# Patient Record
Sex: Female | Born: 1995 | Race: Black or African American | Hispanic: No | Marital: Single | Smoking: Never smoker
Health system: Southern US, Community
[De-identification: ages and names within clinical notes are randomized; demographics above are authoritative.]

## PROBLEM LIST (undated history)

## (undated) ENCOUNTER — Inpatient Hospital Stay (HOSPITAL_COMMUNITY): Payer: Self-pay

## (undated) DIAGNOSIS — R51 Headache: Secondary | ICD-10-CM

## (undated) HISTORY — PX: NO PAST SURGERIES: SHX2092

## (undated) HISTORY — DX: Headache: R51

---
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
## 2013-07-08 IMAGING — US US PELVIS COMPLETE
1 series · 13 of 25 positions shown · non-contrast
Comparison: None.

CLINICAL DATA: Pelvic pain.  Left lower quadrant pelvic pain.
Evaluate for pelvic inflammatory disease, torsion, or abscess.

TRANSABDOMINAL AND TRANSVAGINAL ULTRASOUND OF PELVIS
DOPPLER ULTRASOUND OF OVARIES
TECHNIQUE: Both transabdominal and transvaginal ultrasound
examinations of the pelvis were performed. Transabdominal technique
was performed for global imaging of the pelvis including uterus,
ovaries, adnexal regions, and pelvic cul-de-sac.
It was necessary to proceed with endovaginal exam following the
transabdominal exam to visualize the ovaries and adnexa.
Color and duplex Doppler ultrasound was utilized to evaluate blood
flow to the ovaries.

[Series 1: us pelvis complete · 0.30mm/px · 13 of 90 slices shown]
[im 1/90]
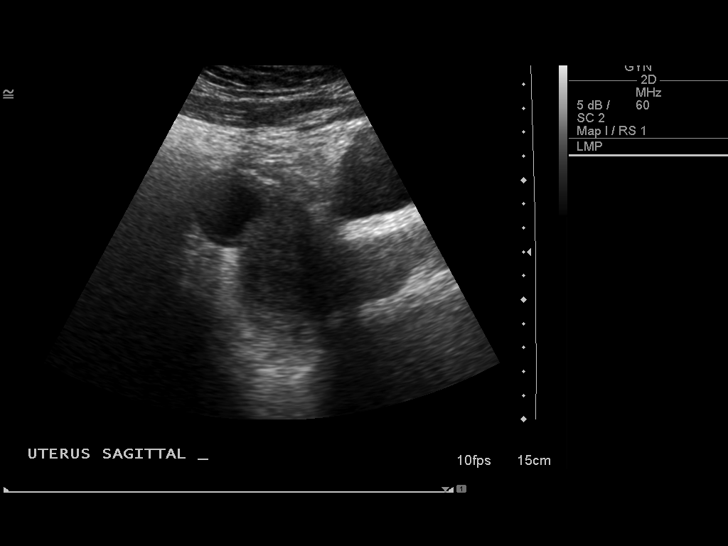
[im 8/90]
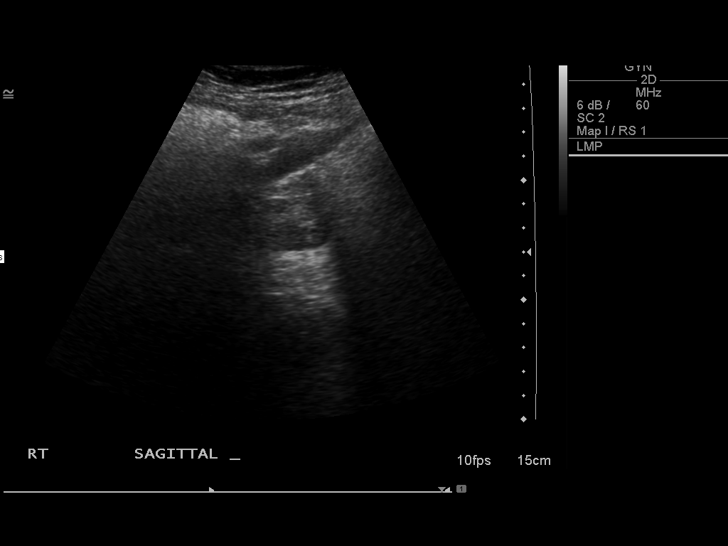
[im 15/90]
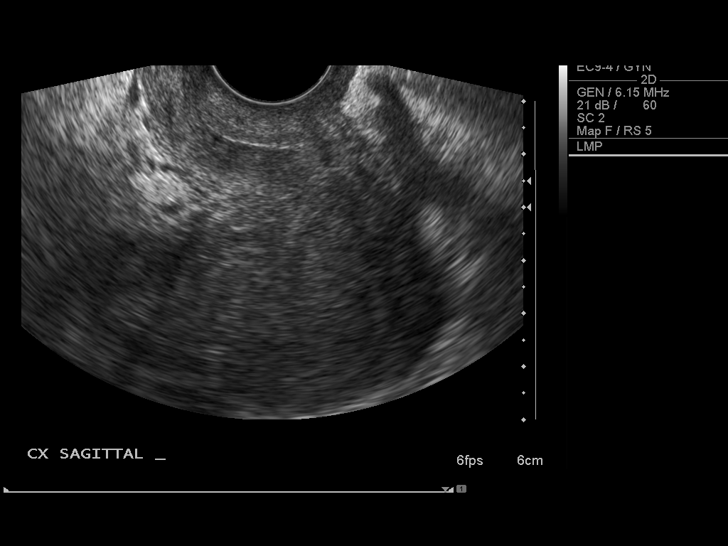
[im 23/90]
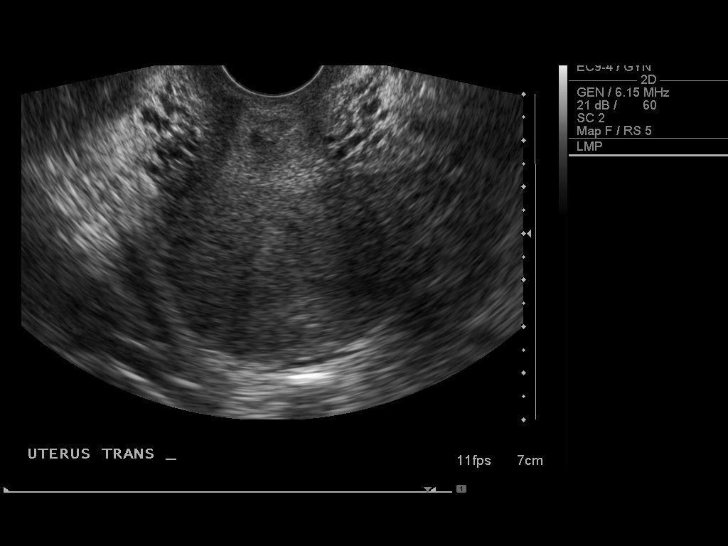
[im 30/90]
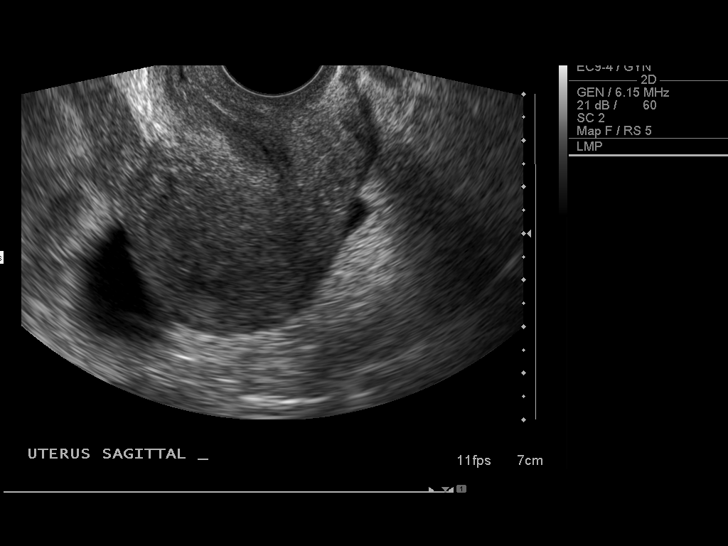
[im 38/90]
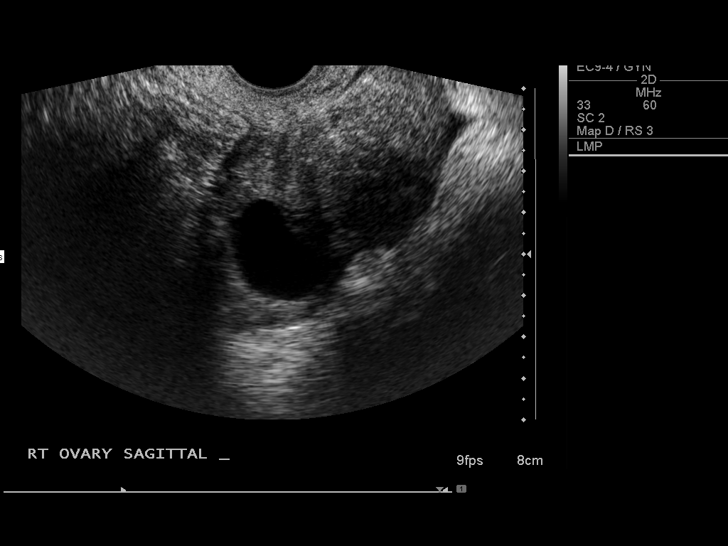
[im 45/90]
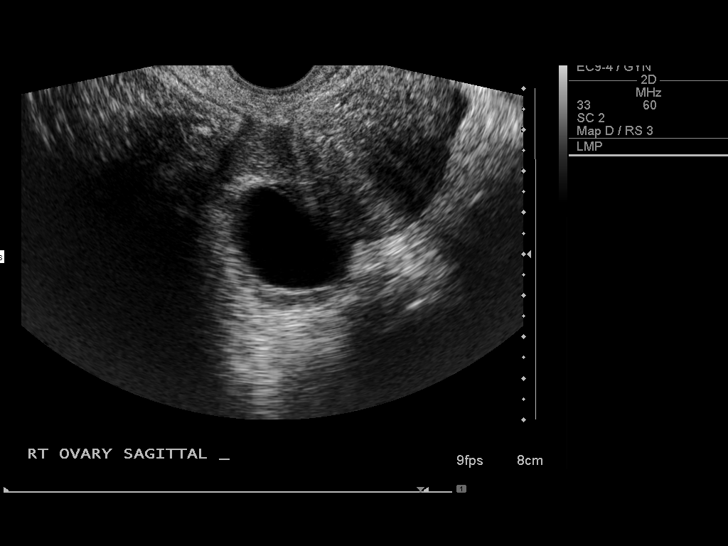
[im 52/90]
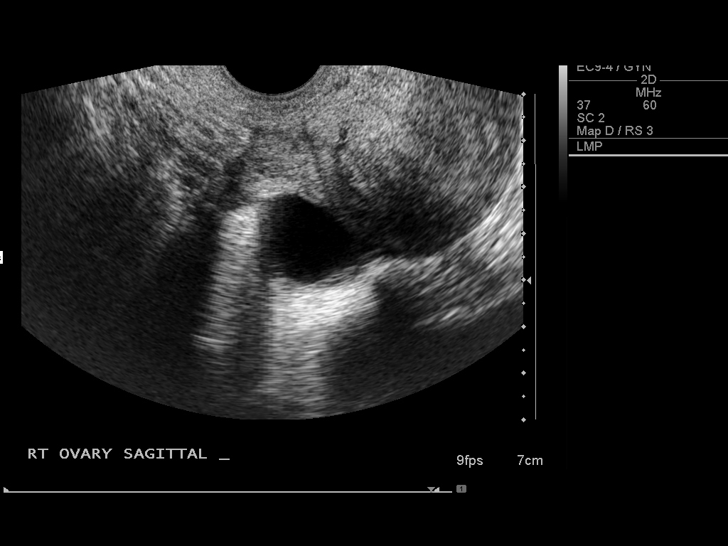
[im 60/90]
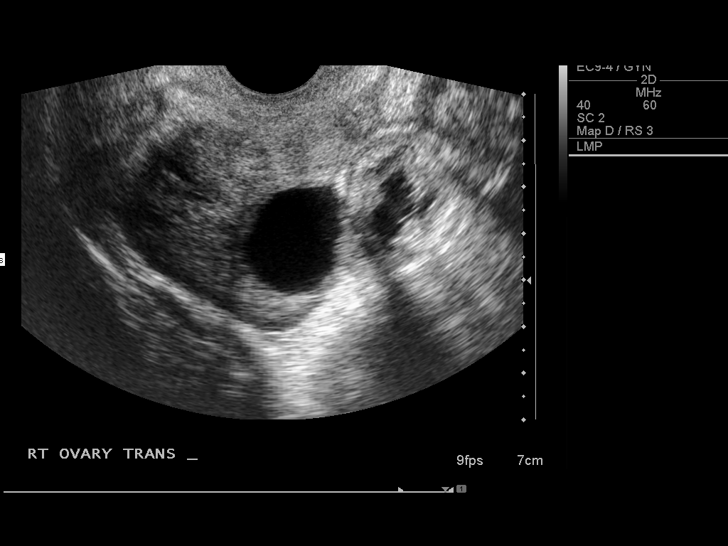
[im 67/90]
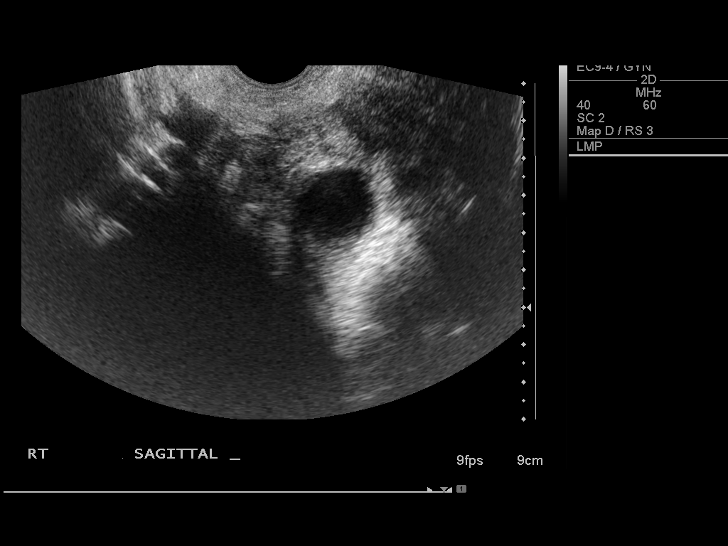
[im 75/90]
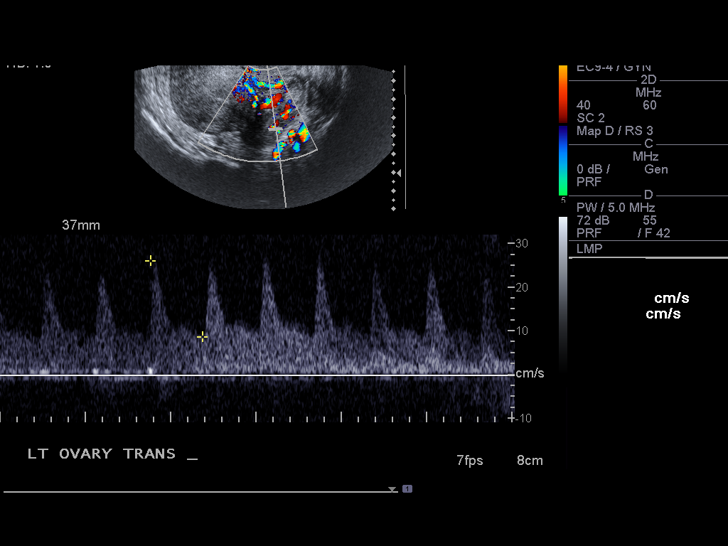
[im 82/90]
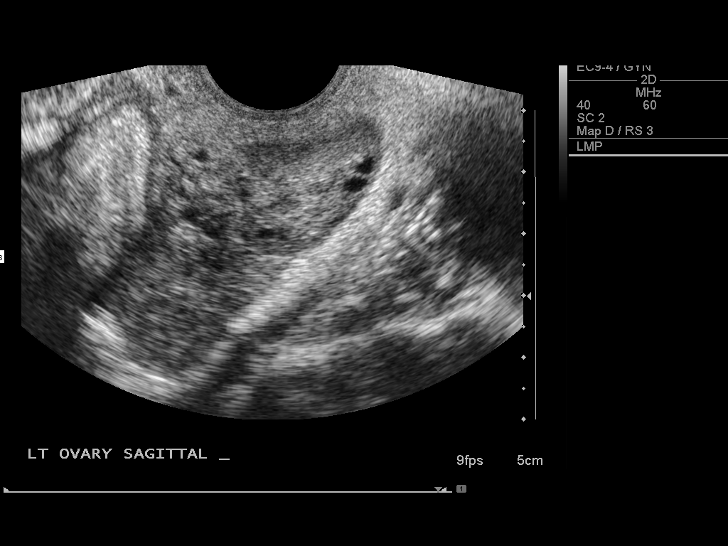
[im 90/90]
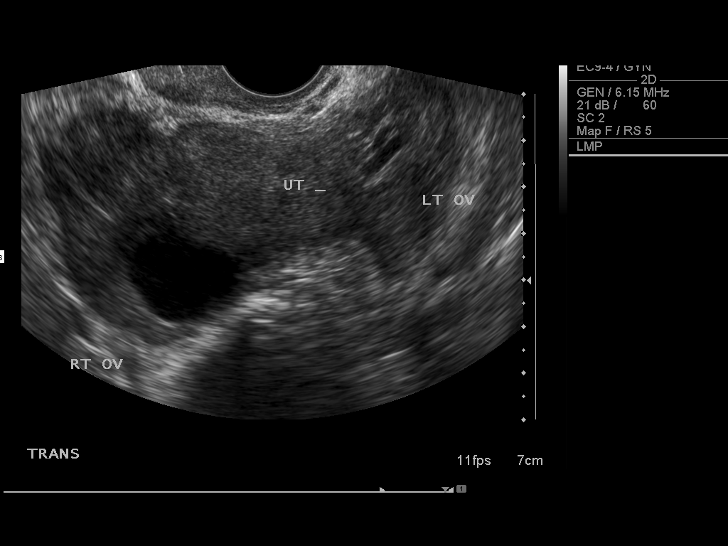

[13 of 25 positions shown; findings below may reference images not displayed]

FINDINGS: Uterus:  Uterus is partly retroverted measures 5.8 x 4.6 x 4.5 cm.
No focal uterine mass is identified.

Endometrium:  Normal in thickness and appearance.  Measures 5 mm.

Right ovary: Right ovary measures approximately 4.8 x 3.5 x 4.5 cm.
The right ovary is prominent in size and contains a 2.8 cm dominant
follicle versus cyst.  Color Doppler flow is identified to the
right ovary.

Left ovary:   Measures 2.3 x 2.9 x 2.1 cm and has normal
appearances. Color Doppler flow is identified to the left ovary. No
cyst or mass is identified in the left ovary. No left adnexal mass
is seen.

Pulsed Doppler evaluation demonstrates normal low-resistance
arterial and venous waveforms in both ovaries.

No definite free pelvic fluid is visualized.

The sonographer reports that the patient did not experience pain
during examination.
IMPRESSION: 1.  Right ovary is slightly prominent in size and contains a 2.8 cm
cyst.  There is normal color Doppler flow to the right ovary.  No
definite sonographic evidence of ovarian torsion.
2.  Normal left ovary
3.  Normal uterus.
4.  No evidence of hydrosalpinx or pelvic abscess.

## 2013-12-06 ENCOUNTER — Encounter (HOSPITAL_COMMUNITY): Payer: Self-pay

## 2013-12-06 ENCOUNTER — Emergency Department (INDEPENDENT_AMBULATORY_CARE_PROVIDER_SITE_OTHER)
Admission: EM | Admit: 2013-12-06 | Discharge: 2013-12-06 | Disposition: A | Discharge status: Home / Self Care | Payer: BC Managed Care – PPO | Source: Home / Self Care | Attending: Family Medicine | Admitting: Family Medicine

## 2013-12-06 DIAGNOSIS — J029 Acute pharyngitis, unspecified: Secondary | ICD-10-CM

## 2013-12-06 LAB — POCT RAPID STREP A: Streptococcus, Group A Screen (Direct): NEGATIVE

## 2013-12-06 NOTE — ED Provider Notes (Signed)
CSN: [id number]     Arrival date & time 12/06/13  1944 History   First MD Initiated Contact with Patient 12/06/13 1959     Chief Complaint  Patient presents with  . Sore Throat   (Consider location/radiation/quality/duration/timing/severity/associated sxs/prior Treatment) Patient is a 18 y.o. female presenting with pharyngitis. The history is provided by the patient.  Sore Throat This is a new problem. The current episode started 1 to 2 hours ago. The problem occurs constantly. The problem has not changed since onset.   Past Medical History  Diagnosis Date  . Headache(784.0)    History reviewed. No pertinent past surgical history. Family History  Problem Relation Age of Onset  . Migraines Mother   . Hypertension Mother   . Diabetes Mother   . Asthma Brother   . Migraines Maternal Aunt    History  Substance Use Topics  . Smoking status: Current Some Day Smoker  . Smokeless tobacco: Never Used  . Alcohol Use: Yes   OB History    Gravida Para Term Preterm AB TAB SAB Ectopic Multiple Living   0 0 0            Review of Systems  All other systems reviewed and are negative.   Allergies  Other  Home Medications   Prior to Admission medications   Medication Sig Start Date End Date Taking? Authorizing Provider  doxycycline (VIBRAMYCIN) 100 MG capsule Take 1 capsule (100 mg total) by mouth 2 (two) times daily. 01/26/12   [name], PA-C  ibuprofen (ADVIL,MOTRIN) 200 MG tablet Take 400 mg by mouth every 6 (six) hours as needed. For pain    Historical Provider, MD  medroxyPROGESTERone (DEPO-PROVERA) 150 MG/ML injection Inject 150 mg into the muscle every 3 (three) months. 08/04/11   Elmira Powell, PA-C  ondansetron (ZOFRAN) 4 MG tablet Take 1 tablet (4 mg total) by mouth every 6 (six) hours. 01/26/12   [name], PA-C  oxyCODONE-acetaminophen (PERCOCET) 10-650 MG per tablet Take 1 tablet by mouth every 6 (six) hours as needed for pain. 01/26/12   Hannah  Muthersbaugh, PA-C   BP 119/80 mmHg  Pulse 102  Temp(Src) 98.3 F (36.8 C) (Oral)  Resp 12  SpO2 100% Physical Exam  Constitutional: She is oriented to person, place, and time. She appears well-developed and well-nourished. No distress.  HENT:  Head: Normocephalic and atraumatic.  Right Ear: Hearing, tympanic membrane, external ear and ear canal normal.  Left Ear: Hearing, tympanic membrane, external ear and ear canal normal.  Nose: Nose normal.  Mouth/Throat: Uvula is midline and mucous membranes are normal. No trismus in the jaw. No uvula swelling. Posterior oropharyngeal erythema present. No oropharyngeal exudate, posterior oropharyngeal edema or tonsillar abscesses.    Eyes: Conjunctivae are normal. No scleral icterus.  Neck: Normal range of motion. Neck supple.  Cardiovascular: Normal rate, regular rhythm and normal heart sounds.   Pulmonary/Chest: Effort normal and breath sounds normal. No stridor.  Lymphadenopathy:    She has no cervical adenopathy.  Neurological: She is alert and oriented to person, place, and time.  Skin: Skin is warm and dry. No rash noted. No erythema.  Psychiatric: She has a normal mood and affect. Her behavior is normal.  Nursing note and vitals reviewed.   ED Course  Procedures (including critical care time) Labs Review Labs Reviewed  POCT RAPID STREP A ([facility] CARE ONLY)    Imaging Review No results found.   MDM   1. Pharyngitis   Rapid strep  negative Symptomatic care at home Follow up if no improvement over the next 4-5 days.     Ria ClockJennifer Lee H Darroll Bredeson, [place]PA 12/06/13 2020

## 2013-12-06 NOTE — ED Notes (Signed)
Reports 4 hour duration of sore throat, parent noted "white spots on tonsils"

## 2013-12-06 NOTE — ED Notes (Signed)
Please call patient at :[phone] for any lab issues

## 2013-12-06 NOTE — Discharge Instructions (Signed)
Strep test negative. Specimen will be held for 3 day culture. If results indicate the need for additional treatment, you will be notified by phone. Use warm salt water gargles, tylenol or ibuprofen as needed for discomfort. Follow up if no improvement over the next 4-5 days.  Pharyngitis Pharyngitis is redness, pain, and swelling (inflammation) of your pharynx.  CAUSES  Pharyngitis is usually caused by infection. Most of the time, these infections are from viruses (viral) and are part of a cold. However, sometimes pharyngitis is caused by bacteria (bacterial). Pharyngitis can also be caused by allergies. Viral pharyngitis may be spread from person to person by coughing, sneezing, and personal items or utensils (cups, forks, spoons, toothbrushes). Bacterial pharyngitis may be spread from person to person by more intimate contact, such as kissing.  SIGNS AND SYMPTOMS  Symptoms of pharyngitis include:   Sore throat.   Tiredness (fatigue).   Low-grade fever.   Headache.  Joint pain and muscle aches.  Skin rashes.  Swollen lymph nodes.  Plaque-like film on throat or tonsils (often seen with bacterial pharyngitis). DIAGNOSIS  Your health care provider will ask you questions about your illness and your symptoms. Your medical history, along with a physical exam, is often all that is needed to diagnose pharyngitis. Sometimes, a rapid strep test is done. Other lab tests may also be done, depending on the suspected cause.  TREATMENT  Viral pharyngitis will usually get better in 3-4 days without the use of medicine. Bacterial pharyngitis is treated with medicines that kill germs (antibiotics).  HOME CARE INSTRUCTIONS   Drink enough water and fluids to keep your urine clear or pale yellow.   Only take over-the-counter or prescription medicines as directed by your health care provider:   If you are prescribed antibiotics, make sure you finish them even if you start to feel better.   Do  not take aspirin.   Get lots of rest.   Gargle with 8 oz of salt water ( tsp of salt per 1 qt of water) as often as every 1-2 hours to soothe your throat.   Throat lozenges (if you are not at risk for choking) or sprays may be used to soothe your throat. SEEK MEDICAL CARE IF:   You have large, tender lumps in your neck.  You have a rash.  You cough up green, yellow-brown, or bloody spit. SEEK IMMEDIATE MEDICAL CARE IF:   Your neck becomes stiff.  You drool or are unable to swallow liquids.  You vomit or are unable to keep medicines or liquids down.  You have severe pain that does not go away with the use of recommended medicines.  You have trouble breathing (not caused by a stuffy nose). MAKE SURE YOU:   Understand these instructions.  Will watch your condition.  Will get help right away if you are not doing well or get worse. Document Released: 01/12/2005 Document Revised: 11/02/2012 Document Reviewed: 09/19/2012 [facility] Patient Information 2015 [place], [place]. This information is not intended to replace advice given to you by your health care provider. Make sure you discuss any questions you have with your health care provider.

## 2013-12-07 ENCOUNTER — Emergency Department (HOSPITAL_COMMUNITY)
Admission: EM | Admit: 2013-12-07 | Discharge: 2013-12-07 | Disposition: A | Discharge status: Home / Self Care | Payer: BC Managed Care – PPO | Attending: Emergency Medicine | Admitting: Emergency Medicine

## 2013-12-07 ENCOUNTER — Encounter (HOSPITAL_COMMUNITY): Payer: Self-pay

## 2013-12-07 DIAGNOSIS — Z79899 Other long term (current) drug therapy: Secondary | ICD-10-CM | POA: Diagnosis not present

## 2013-12-07 DIAGNOSIS — Z792 Long term (current) use of antibiotics: Secondary | ICD-10-CM | POA: Insufficient documentation

## 2013-12-07 DIAGNOSIS — Z720 Tobacco use: Secondary | ICD-10-CM | POA: Insufficient documentation

## 2013-12-07 DIAGNOSIS — J029 Acute pharyngitis, unspecified: Secondary | ICD-10-CM | POA: Diagnosis not present

## 2013-12-07 LAB — CBC WITH DIFFERENTIAL/PLATELET
BASOS ABS: 0 10*3/uL (ref 0.0–0.1)
Basophils Relative: 0 % (ref 0–1)
EOS PCT: 0 % (ref 0–5)
Eosinophils Absolute: 0 10*3/uL (ref 0.0–0.7)
HEMATOCRIT: 37.7 % (ref 36.0–46.0)
Hemoglobin: 12.5 g/dL (ref 12.0–15.0)
Lymphocytes Relative: 14 % (ref 12–46)
Lymphs Abs: 1.9 10*3/uL (ref 0.7–4.0)
MCH: 30.8 pg (ref 26.0–34.0)
MCHC: 33.2 g/dL (ref 30.0–36.0)
MCV: 92.9 fL (ref 78.0–100.0)
MONO ABS: 1.4 10*3/uL — AB (ref 0.1–1.0)
Monocytes Relative: 10 % (ref 3–12)
Neutro Abs: 10.4 10*3/uL — ABNORMAL HIGH (ref 1.7–7.7)
Neutrophils Relative %: 76 % (ref 43–77)
Platelets: 374 10*3/uL (ref 150–400)
RBC: 4.06 MIL/uL (ref 3.87–5.11)
RDW: 12.8 % (ref 11.5–15.5)
WBC: 13.7 10*3/uL — ABNORMAL HIGH (ref 4.0–10.5)

## 2013-12-07 LAB — MONONUCLEOSIS SCREEN: Mono Screen: NEGATIVE

## 2013-12-07 LAB — RAPID STREP SCREEN (MED CTR MEBANE ONLY): STREPTOCOCCUS, GROUP A SCREEN (DIRECT): NEGATIVE

## 2013-12-07 MED ORDER — PREDNISONE 10 MG PO TABS: 20.0000 mg | ORAL_TABLET | Freq: Every day | ORAL | Status: DC

## 2013-12-07 MED ORDER — PREDNISONE 20 MG PO TABS
60.0000 mg | ORAL_TABLET | Freq: Once | ORAL | Status: AC
  Administered 2013-12-07: 60 mg via ORAL
  Filled 2013-12-07: qty 3

## 2013-12-07 MED ORDER — ACETAMINOPHEN 500 MG PO TABS
500.0000 mg | ORAL_TABLET | Freq: Once | ORAL | Status: AC
  Administered 2013-12-07: 500 mg via ORAL
  Filled 2013-12-07: qty 1

## 2013-12-07 MED ORDER — PENICILLIN V POTASSIUM 500 MG PO TABS: 500.0000 mg | ORAL_TABLET | Freq: Four times a day (QID) | ORAL | Status: DC

## 2013-12-07 MED ORDER — IBUPROFEN 800 MG PO TABS
800.0000 mg | ORAL_TABLET | Freq: Once | ORAL | Status: AC
  Administered 2013-12-07: 800 mg via ORAL
  Filled 2013-12-07: qty 1

## 2013-12-07 MED ORDER — HYDROCODONE-ACETAMINOPHEN 5-325 MG PO TABS
1.0000 | ORAL_TABLET | Freq: Once | ORAL | Status: AC
  Administered 2013-12-07: 1 via ORAL
  Filled 2013-12-07: qty 1

## 2013-12-07 MED ORDER — HYDROCODONE-ACETAMINOPHEN 5-325 MG PO TABS: 1.0000 | ORAL_TABLET | ORAL | Status: DC | PRN

## 2013-12-07 NOTE — ED Provider Notes (Signed)
CSN: [id number]     Arrival date & time 12/07/13  1355 History   First MD Initiated Contact with Patient 12/07/13 1459     Chief Complaint  Patient presents with  . Sore Throat     HPI  Patient presented evaluation of sore throat and swollen tonsils. Present sore throat for 3 weeks. Negative strep 2 days ago at urgent care. Continues with her trouble swallowing. Painful swallowing. Symmetric pain no localizing pain not drooling. Abdominal pain. No rash. No joint pain.  Past Medical History  Diagnosis Date  . Headache(784.0)    History reviewed. No pertinent past surgical history. Family History  Problem Relation Age of Onset  . Migraines Mother   . Hypertension Mother   . Diabetes Mother   . Asthma Brother   . Migraines Maternal Aunt    History  Substance Use Topics  . Smoking status: Current Some Day Smoker  . Smokeless tobacco: Never Used  . Alcohol Use: Yes   OB History    Gravida Para Term Preterm AB TAB SAB Ectopic Multiple Living   0 0 0            Review of Systems  Constitutional: Positive for fever. Negative for chills, diaphoresis, appetite change and fatigue.  HENT: Positive for sore throat. Negative for mouth sores and trouble swallowing.   Eyes: Negative for visual disturbance.  Respiratory: Negative for cough, chest tightness, shortness of breath and wheezing.   Cardiovascular: Negative for chest pain.  Gastrointestinal: Negative for nausea, vomiting, abdominal pain, diarrhea and abdominal distention.  Endocrine: Negative for polydipsia, polyphagia and polyuria.  Genitourinary: Negative for dysuria, frequency and hematuria.  Musculoskeletal: Negative for gait problem.  Skin: Negative for color change, pallor and rash.  Neurological: Negative for dizziness, syncope, light-headedness and headaches.  Hematological: Does not bruise/bleed easily.  Psychiatric/Behavioral: Negative for behavioral problems and confusion.      Allergies  Other  Home  Medications   Prior to Admission medications   Medication Sig Start Date End Date Taking? Authorizing Provider  guaiFENesin (ROBITUSSIN) 100 MG/5ML liquid Take 400 mg by mouth 3 (three) times daily as needed for cough (cough).   Yes Historical Provider, MD  ibuprofen (ADVIL,MOTRIN) 200 MG tablet Take 600 mg by mouth every 6 (six) hours as needed for headache (headache). For pain   Yes Historical Provider, MD  doxycycline (VIBRAMYCIN) 100 MG capsule Take 1 capsule (100 mg total) by mouth 2 (two) times daily. 01/26/12   [name], PA-C  HYDROcodone-acetaminophen (NORCO/VICODIN) 5-325 MG per tablet Take 1 tablet by mouth every 4 (four) hours as needed. 12/07/13   Rolland PorterMark Prithvi Kooi, MD  medroxyPROGESTERone (DEPO-PROVERA) 150 MG/ML injection Inject 150 mg into the muscle every 3 (three) months. 08/04/11   Elmira Powell, PA-C  ondansetron (ZOFRAN) 4 MG tablet Take 1 tablet (4 mg total) by mouth every 6 (six) hours. 01/26/12   [name], PA-C  oxyCODONE-acetaminophen (PERCOCET) 10-650 MG per tablet Take 1 tablet by mouth every 6 (six) hours as needed for pain. 01/26/12   [name], PA-C  penicillin v potassium (VEETID) 500 MG tablet Take 1 tablet (500 mg total) by mouth 4 (four) times daily. 12/07/13   Rolland PorterMark Clearance Chenault, MD  predniSONE (DELTASONE) 10 MG tablet Take 2 tablets (20 mg total) by mouth daily. 12/07/13   Rolland PorterMark Mareta Chesnut, MD   BP 119/67 mmHg  Pulse 102  Temp(Src) 102 F (38.9 C) (Oral)  Resp 18  SpO2 97% Physical Exam  Constitutional: She is oriented  to person, place, and time. She appears well-developed and well-nourished. No distress.  HENT:  Head: Normocephalic.  Mouth/Throat: Oropharyngeal exudate present.  3+ symmetric tonsillar hypertrophy. Critical tonsils. White exudate pretreated petechiae the palate.*Breakdown. Bilateral intracervical lymph nodes. No posterior cervical lymph nodes. No hepatosplenomegaly.  Eyes: Conjunctivae are normal. Pupils are equal, round, and  reactive to light. No scleral icterus.  Neck: Normal range of motion. Neck supple. No thyromegaly present.  Cardiovascular: Normal rate and regular rhythm.  Exam reveals no gallop and no friction rub.   No murmur heard. Pulmonary/Chest: Effort normal and breath sounds normal. No respiratory distress. She has no wheezes. She has no rales.  Abdominal: Soft. Bowel sounds are normal. She exhibits no distension. There is no tenderness. There is no rebound.  Musculoskeletal: Normal range of motion.  Lymphadenopathy:    She has cervical adenopathy.  Neurological: She is alert and oriented to person, place, and time.  Skin: Skin is warm and dry. No rash noted.  Psychiatric: She has a normal mood and affect. Her behavior is normal.    ED Course  Procedures (including critical care time) Labs Review Labs Reviewed  CBC WITH DIFFERENTIAL - Abnormal; Notable for the following:    WBC 13.7 (*)    Neutro Abs 10.4 (*)    Monocytes Absolute 1.4 (*)    All other components within normal limits  RAPID STREP SCREEN  CULTURE, GROUP A STREP  MONONUCLEOSIS SCREEN    Imaging Review No results found.   EKG Interpretation None      MDM   Final diagnoses:  Pharyngitis    No atypical lymphocytes. Negative mono screen for heterophile antibody. Negative initial strep. Fever improved after, Motrin. Plan discharge home. She has multiple criteria for strep with petechiae, enlarged lymph nodes, fever, next tonsils. One particular treatment penicillin pending her culture despite her negative initial negative strep. Plan also Vicodin prednisone for pain and swelling at home.    Rolland PorterMark Christal Lagerstrom, MD 12/07/13 [phone]

## 2013-12-07 NOTE — ED Notes (Signed)
MD notified of temperature

## 2013-12-07 NOTE — Discharge Instructions (Signed)

## 2013-12-07 NOTE — ED Notes (Signed)
Per pt, went to urgent care last night.  Pt had negative strep.  Pt told to take tylenol for pain.  Pt states pain not improved.

## 2013-12-08 LAB — CULTURE, GROUP A STREP

## 2013-12-09 ENCOUNTER — Telehealth (HOSPITAL_COMMUNITY): Payer: Self-pay | Admitting: *Deleted

## 2013-12-09 NOTE — ED Notes (Signed)
Throat culture: Strep beta hemolytic not group A. Pt. Was treated with Rx. Veetid 11/12 by the ED.  I called pt. Pt. verified x 2 and given result. Pt. told she is adequately treated and to finish all of the medication. If not better to get rechecked. If anyone she exposed gets the same symptoms should get checked for strep as well.  Pt. voiced understanding. Vassie MoselleYork, Crystal Carrillo 12/09/2013

## 2013-12-12 ENCOUNTER — Encounter (HOSPITAL_COMMUNITY): Payer: Self-pay | Admitting: *Deleted

## 2013-12-12 ENCOUNTER — Emergency Department (HOSPITAL_COMMUNITY)
Admission: EM | Admit: 2013-12-12 | Discharge: 2013-12-12 | Disposition: A | Discharge status: Home / Self Care | Payer: BC Managed Care – PPO | Attending: Emergency Medicine | Admitting: Emergency Medicine

## 2013-12-12 DIAGNOSIS — Z3202 Encounter for pregnancy test, result negative: Secondary | ICD-10-CM | POA: Diagnosis not present

## 2013-12-12 DIAGNOSIS — K5900 Constipation, unspecified: Secondary | ICD-10-CM | POA: Diagnosis not present

## 2013-12-12 DIAGNOSIS — Z720 Tobacco use: Secondary | ICD-10-CM | POA: Insufficient documentation

## 2013-12-12 DIAGNOSIS — Z792 Long term (current) use of antibiotics: Secondary | ICD-10-CM | POA: Insufficient documentation

## 2013-12-12 DIAGNOSIS — Z7952 Long term (current) use of systemic steroids: Secondary | ICD-10-CM | POA: Diagnosis not present

## 2013-12-12 DIAGNOSIS — Z79899 Other long term (current) drug therapy: Secondary | ICD-10-CM | POA: Insufficient documentation

## 2013-12-12 DIAGNOSIS — N390 Urinary tract infection, site not specified: Secondary | ICD-10-CM | POA: Insufficient documentation

## 2013-12-12 LAB — URINE MICROSCOPIC-ADD ON

## 2013-12-12 LAB — POC URINE PREG, ED: Preg Test, Ur: NEGATIVE

## 2013-12-12 LAB — URINALYSIS, ROUTINE W REFLEX MICROSCOPIC
Bilirubin Urine: NEGATIVE
Glucose, UA: NEGATIVE mg/dL
Ketones, ur: NEGATIVE mg/dL
Nitrite: NEGATIVE
PH: 6.5 (ref 5.0–8.0)
Protein, ur: NEGATIVE mg/dL
SPECIFIC GRAVITY, URINE: 1.025 (ref 1.005–1.030)
Urobilinogen, UA: 0.2 mg/dL (ref 0.0–1.0)

## 2013-12-12 LAB — POC OCCULT BLOOD, ED: Fecal Occult Bld: POSITIVE — AB

## 2013-12-12 MED ORDER — SULFAMETHOXAZOLE-TRIMETHOPRIM 800-160 MG PO TABS: 1.0000 | ORAL_TABLET | Freq: Two times a day (BID) | ORAL | Status: DC

## 2013-12-12 MED ORDER — DOCUSATE SODIUM 100 MG PO CAPS: 100.0000 mg | ORAL_CAPSULE | Freq: Two times a day (BID) | ORAL | Status: DC | PRN

## 2013-12-12 MED ORDER — IBUPROFEN 200 MG PO TABS
600.0000 mg | ORAL_TABLET | Freq: Once | ORAL | Status: AC
  Administered 2013-12-12: 600 mg via ORAL
  Filled 2013-12-12: qty 3

## 2013-12-12 MED ORDER — SULFAMETHOXAZOLE-TRIMETHOPRIM 800-160 MG PO TABS
1.0000 | ORAL_TABLET | Freq: Two times a day (BID) | ORAL | Status: DC
Start: 2013-12-12 — End: 2015-05-23

## 2013-12-12 NOTE — Progress Notes (Signed)
  CARE MANAGEMENT ED NOTE 12/12/2013  Patient:  Kathrene AluMARTIN,Sherene   Account Number:  [id number]  Date Initiated:  12/12/2013  Documentation initiated by:  Edd ArbourGIBBS,KIMBERLY  Subjective/Objective Assessment:   18 yr old female who states "I was student" "lost insurance card"  currently being treated with abx for strep throat, reports she started having rectal pain and constipation x2 days.     Subjective/Objective Assessment Detail:   [place] via mother Pilar Jar[place], [place]    Pt wrote dow customer service number [phone]  and CM office number if further questions  Voiced understanding of what cm reviewed  Able to repeat back to CM what was discussed with her about online, customer services and f/u with pc for ongoing or chronic s/s     Action/Plan:   ED cm noted ED visits x 3 in last month, discussed with pt, discussed pcp services vs EDP services (differences, monitoring of chronic medical issues)  see notes below Provided name of coverage, policy # per request   Action/Plan Detail:   Discussed also how to obtain specialist prn   Anticipated DC Date:       Status Recommendation to Physician:   Result of Recommendation:    Other ED Services  Consult Working Plan    DC Planning Services  Other  Outpatient Services - Pt will follow up  PCP issues    Choice offered to / List presented to:            Status of service:  Completed, signed off  ED Comments:   ED Comments Detail:  [facility] ED CM spoke with pt on how to obtain an in network pcp with insurance coverage via the customer service number or web site Cm reviewed ED level of care for crisis/emergent services and community pcp level of care to manage continuous or chronic medical concerns.  The pt voiced understanding CM encouraged pt and discussed pt's responsibility to verify with pt's insurance carrier that any recommended medical provider offered by any emergency room or a  hospital provider is within the carrier's network. The pt voiced understanding

## 2013-12-12 NOTE — ED Notes (Signed)
Pt reports she is currently being treated with abx for strep throat, reports she started having rectal pain and constipation x2 days. Pain 7/10 at present. Denies anal sex. Pt has tried prep h cream and wipes, used enema last night. Passed small amount of stool after enema with pain. Last period Oct 2.

## 2013-12-12 NOTE — Discharge Instructions (Signed)
Constipation °Constipation is when a person has fewer than three bowel movements a week, has difficulty having a bowel movement, or has stools that are dry, hard, or larger than normal. As people grow older, constipation is more common. If you try to fix constipation with medicines that make you have a bowel movement (laxatives), the problem may get worse. Long-term laxative use may cause the muscles of the colon to become weak. A low-fiber diet, not taking in enough fluids, and taking certain medicines may make constipation worse.  °CAUSES  °· Certain medicines, such as antidepressants, pain medicine, iron supplements, antacids, and water pills.   °· Certain diseases, such as diabetes, irritable bowel syndrome (IBS), thyroid disease, or depression.   °· Not drinking enough water.   °· Not eating enough fiber-rich foods.   °· Stress or travel.   °· Lack of physical activity or exercise.   °· Ignoring the urge to have a bowel movement.   °· Using laxatives too much.   °SIGNS AND SYMPTOMS  °· Having fewer than three bowel movements a week.   °· Straining to have a bowel movement.   °· Having stools that are hard, dry, or larger than normal.   °· Feeling full or bloated.   °· Pain in the lower abdomen.   °· Not feeling relief after having a bowel movement.   °DIAGNOSIS  °Your health care provider will take a medical history and perform a physical exam. Further testing may be done for severe constipation. Some tests may include: °· A barium enema X-ray to examine your rectum, colon, and, sometimes, your small intestine.   °· A sigmoidoscopy to examine your lower colon.   °· A colonoscopy to examine your entire colon. °TREATMENT  °Treatment will depend on the severity of your constipation and what is causing it. Some dietary treatments include drinking more fluids and eating more fiber-rich foods. Lifestyle treatments may include regular exercise. If these diet and lifestyle recommendations do not help, your health care  provider may recommend taking over-the-counter laxative medicines to help you have bowel movements. Prescription medicines may be prescribed if over-the-counter medicines do not work.  °HOME CARE INSTRUCTIONS  °· Eat foods that have a lot of fiber, such as fruits, vegetables, whole grains, and beans. °· Limit foods high in fat and processed sugars, such as french fries, hamburgers, cookies, candies, and soda.   °· A fiber supplement may be added to your diet if you cannot get enough fiber from foods.   °· Drink enough fluids to keep your urine clear or pale yellow.   °· Exercise regularly or as directed by your health care provider.   °· Go to the restroom when you have the urge to go. Do not hold it.   °· Only take over-the-counter or prescription medicines as directed by your health care provider. Do not take other medicines for constipation without talking to your health care provider first.   °SEEK IMMEDIATE MEDICAL CARE IF:  °· You have bright red blood in your stool.   °· Your constipation lasts for more than 4 days or gets worse.   °· You have abdominal or rectal pain.   °· You have thin, pencil-like stools.   °· You have unexplained weight loss. °MAKE SURE YOU:  °· Understand these instructions. °· Will watch your condition. °· Will get help right away if you are not doing well or get worse. °Document Released: 10/11/2003 Document Revised: 01/17/2013 Document Reviewed: 10/24/2012 °ExitCare® Patient Information ©2015 [employer]. This information is not intended to replace advice given to you by your health care provider. Make sure you discuss any questions   you have with your health care provider. °Urinary Tract Infection °Urinary tract infections (UTIs) can develop anywhere along your urinary tract. Your urinary tract is your body's drainage system for removing wastes and extra water. Your urinary tract includes two kidneys, two ureters, a bladder, and a urethra. Your kidneys are a pair of bean-shaped  organs. Each kidney is about the size of your fist. They are located below your ribs, one on each side of your spine. °CAUSES °Infections are caused by microbes, which are microscopic organisms, including fungi, viruses, and bacteria. These organisms are so small that they can only be seen through a microscope. Bacteria are the microbes that most commonly cause UTIs. °SYMPTOMS  °Symptoms of UTIs may vary by age and gender of the patient and by the location of the infection. Symptoms in young women typically include a frequent and intense urge to urinate and a painful, burning feeling in the bladder or urethra during urination. Older women and men are more likely to be tired, shaky, and weak and have muscle aches and abdominal pain. A fever may mean the infection is in your kidneys. Other symptoms of a kidney infection include pain in your back or sides below the ribs, nausea, and vomiting. °DIAGNOSIS °To diagnose a UTI, your caregiver will ask you about your symptoms. Your caregiver also will ask to provide a urine sample. The urine sample will be tested for bacteria and white blood cells. White blood cells are made by your body to help fight infection. °TREATMENT  °Typically, UTIs can be treated with medication. Because most UTIs are caused by a bacterial infection, they usually can be treated with the use of antibiotics. The choice of antibiotic and length of treatment depend on your symptoms and the type of bacteria causing your infection. °HOME CARE INSTRUCTIONS °· If you were prescribed antibiotics, take them exactly as your caregiver instructs you. Finish the medication even if you feel better after you have only taken some of the medication. °· Drink enough water and fluids to keep your urine clear or pale yellow. °· Avoid caffeine, tea, and carbonated beverages. They tend to irritate your bladder. °· Empty your bladder often. Avoid holding urine for long periods of time. °· Empty your bladder before and  after sexual intercourse. °· After a bowel movement, women should cleanse from front to back. Use each tissue only once. °SEEK MEDICAL CARE IF:  °· You have back pain. °· You develop a fever. °· Your symptoms do not begin to resolve within 3 days. °SEEK IMMEDIATE MEDICAL CARE IF:  °· You have severe back pain or lower abdominal pain. °· You develop chills. °· You have nausea or vomiting. °· You have continued burning or discomfort with urination. °MAKE SURE YOU:  °· Understand these instructions. °· Will watch your condition. °· Will get help right away if you are not doing well or get worse. °Document Released: 10/22/2004 Document Revised: 07/14/2011 Document Reviewed: 02/20/2011 °ExitCare® Patient Information ©2015 [employer]. This information is not intended to replace advice given to you by your health care provider. Make sure you discuss any questions you have with your health care provider. ° °

## 2013-12-12 NOTE — ED Provider Notes (Signed)
CSN: [id number]     Arrival date & time 12/12/13  [id number] History   First MD Initiated Contact with Patient 12/12/13 [id number]     Chief Complaint  Patient presents with  . Constipation  . Rectal Pain     (Consider location/radiation/quality/duration/timing/severity/associated sxs/prior Treatment) Patient is a 18 y.o. female presenting with constipation.  Constipation Severity:  Severe Time since last bowel movement:  2 days Timing:  Constant Progression:  Worsening Chronicity:  New Context comment:  Taking antibiotics for strep throat Stool description:  Hard Ineffective treatments: Ex-Lax. Associated symptoms: dysuria (pressure)   Associated symptoms: no abdominal pain, no nausea and no vomiting   Associated symptoms comment:  Rectal pain   Past Medical History  Diagnosis Date  . Headache(784.0)    History reviewed. No pertinent past surgical history. Family History  Problem Relation Age of Onset  . Migraines Mother   . Hypertension Mother   . Diabetes Mother   . Asthma Brother   . Migraines Maternal Aunt    History  Substance Use Topics  . Smoking status: Current Some Day Smoker  . Smokeless tobacco: Never Used  . Alcohol Use: Yes   OB History    Gravida Para Term Preterm AB TAB SAB Ectopic Multiple Living   0 0 0            Review of Systems  Gastrointestinal: Positive for constipation. Negative for nausea, vomiting and abdominal pain.  Genitourinary: Positive for dysuria (pressure).  All other systems reviewed and are negative.     Allergies  Bee venom  Home Medications   Prior to Admission medications   Medication Sig Start Date End Date Taking? Authorizing Provider  bismuth subsalicylate (PEPTO BISMOL) 262 MG/15ML suspension Take 10 mLs by mouth every 6 (six) hours as needed for indigestion or diarrhea or loose stools.   Yes Historical Provider, MD  HYDROcodone-acetaminophen (NORCO/VICODIN) 5-325 MG per tablet Take 1 tablet by mouth every 4 (four)  hours as needed. 12/07/13  Yes [name], MD  ibuprofen (ADVIL,MOTRIN) 200 MG tablet Take 600 mg by mouth every 6 (six) hours as needed for headache (headache). For pain   Yes Historical Provider, MD  penicillin v potassium (VEETID) 500 MG tablet Take 1 tablet (500 mg total) by mouth 4 (four) times daily. 12/07/13  Yes [name], MD  phenylephrine-shark liver oil-mineral oil-petrolatum (PREPARATION H) 0.25-3-14-71.9 % rectal ointment Place 1 application rectally 2 (two) times daily as needed for hemorrhoids.   Yes Historical Provider, MD  predniSONE (DELTASONE) 10 MG tablet Take 2 tablets (20 mg total) by mouth daily. 12/07/13  Yes [name], MD  Sennosides (EX-LAX) 15 MG TABS Take 2 tablets by mouth once.   Yes Historical Provider, MD  Witch Hazel (PREPARATION H FOR WOMEN) 20 % PADS Apply 1 application topically every 6 (six) hours as needed (for constipation).   Yes Historical Provider, MD  doxycycline (VIBRAMYCIN) 100 MG capsule Take 1 capsule (100 mg total) by mouth 2 (two) times daily. Patient not taking: Reported on 12/12/2013 01/26/12   Dahlia ClientHannah Muthersbaugh, PA-C  ondansetron (ZOFRAN) 4 MG tablet Take 1 tablet (4 mg total) by mouth every 6 (six) hours. Patient not taking: Reported on 12/12/2013 01/26/12   Dahlia ClientHannah Muthersbaugh, PA-C  oxyCODONE-acetaminophen (PERCOCET) 10-650 MG per tablet Take 1 tablet by mouth every 6 (six) hours as needed for pain. Patient not taking: Reported on 12/12/2013 01/26/12   Dahlia ClientHannah Muthersbaugh, PA-C   BP 126/71 mmHg  Pulse 111  Temp(Src) 99.4  F (37.4 C) (Oral)  Resp 17  SpO2 99%  LMP 10/27/2013 Physical Exam  Constitutional: She is oriented to person, place, and time. She appears well-developed and well-nourished. No distress.  HENT:  Head: Normocephalic and atraumatic.  Eyes: Conjunctivae are normal. No scleral icterus.  Neck: Neck supple.  Cardiovascular: Normal rate and intact distal pulses.   Pulmonary/Chest: Effort normal. No stridor. No  respiratory distress.  Abdominal: Normal appearance. She exhibits no distension. There is no tenderness. There is no rigidity, no rebound and no guarding.  Genitourinary: Rectal exam shows tenderness. Rectal exam shows no external hemorrhoid, no internal hemorrhoid, no fissure (exam limited by body habitus and ability to tolerate due to pain) and no mass. Guaiac positive stool (minimal brown stool).  Neurological: She is alert and oriented to person, place, and time.  Skin: Skin is warm and dry. No rash noted.  Psychiatric: She has a normal mood and affect. Her behavior is normal.  Nursing note and vitals reviewed.   ED Course  Procedures (including critical care time) Labs Review Labs Reviewed  URINALYSIS, ROUTINE W REFLEX MICROSCOPIC - Abnormal; Notable for the following:    Hgb urine dipstick TRACE (*)    Leukocytes, UA SMALL (*)    All other components within normal limits  URINE MICROSCOPIC-ADD ON - Abnormal; Notable for the following:    Squamous Epithelial / LPF FEW (*)    Bacteria, UA MANY (*)    All other components within normal limits  POC OCCULT BLOOD, ED - Abnormal; Notable for the following:    Fecal Occult Bld POSITIVE (*)    All other components within normal limits  POC URINE PREG, ED    Imaging Review No results found.   EKG Interpretation None      MDM   Final diagnoses:  Constipation, unspecified constipation type  UTI (lower urinary tract infection)    18 yo female taking abx for strep throat who presents with two days of constipation and painful defecation.  Well appearing, no abdominal pain, no vomiting.  No perianal abscess seen or felt on exam.  Her urinalysis was indicative of urinary infection, consistent with her dysuria and urinary frequency. She developed a fever during her ED course, which was treated with ibuprofen. I suspect this comes from her urinary infection. After her fever resolved, she was sitting upright on the bed, well-appearing,  nontoxic. She was given antibiotics for her urinary infection. She was given return precautions abdominal pain, worsening anal pain, or other concerning symptoms.    Warnell Foresterrey Luisalberto Beegle, MD 12/12/13 [id number]

## 2015-03-13 ENCOUNTER — Other Ambulatory Visit: Payer: Self-pay | Admitting: Physician Assistant

## 2015-03-13 ENCOUNTER — Other Ambulatory Visit (HOSPITAL_COMMUNITY): Admission: RE | Admit: 2015-03-13 | Payer: Self-pay | Source: Ambulatory Visit | Admitting: Family Medicine

## 2015-03-13 ENCOUNTER — Other Ambulatory Visit (HOSPITAL_COMMUNITY)
Admission: RE | Admit: 2015-03-13 | Discharge: 2015-03-13 | Disposition: A | Discharge status: Home / Self Care | Payer: BLUE CROSS/BLUE SHIELD | Source: Ambulatory Visit | Attending: Family Medicine | Admitting: Family Medicine

## 2015-03-13 DIAGNOSIS — Z124 Encounter for screening for malignant neoplasm of cervix: Secondary | ICD-10-CM | POA: Diagnosis not present

## 2015-03-15 LAB — CYTOLOGY - PAP

## 2015-05-22 ENCOUNTER — Encounter (HOSPITAL_COMMUNITY): Payer: Self-pay | Admitting: Emergency Medicine

## 2015-05-22 ENCOUNTER — Emergency Department (HOSPITAL_COMMUNITY)
Admission: EM | Admit: 2015-05-22 | Discharge: 2015-05-23 | Disposition: A | Discharge status: Home / Self Care | Payer: BLUE CROSS/BLUE SHIELD | Attending: Emergency Medicine | Admitting: Emergency Medicine

## 2015-05-22 DIAGNOSIS — F17200 Nicotine dependence, unspecified, uncomplicated: Secondary | ICD-10-CM | POA: Insufficient documentation

## 2015-05-22 DIAGNOSIS — N926 Irregular menstruation, unspecified: Secondary | ICD-10-CM | POA: Insufficient documentation

## 2015-05-22 DIAGNOSIS — Z7952 Long term (current) use of systemic steroids: Secondary | ICD-10-CM | POA: Diagnosis not present

## 2015-05-22 DIAGNOSIS — R1030 Lower abdominal pain, unspecified: Secondary | ICD-10-CM | POA: Diagnosis present

## 2015-05-22 DIAGNOSIS — Z3202 Encounter for pregnancy test, result negative: Secondary | ICD-10-CM | POA: Insufficient documentation

## 2015-05-22 DIAGNOSIS — Z792 Long term (current) use of antibiotics: Secondary | ICD-10-CM | POA: Diagnosis not present

## 2015-05-22 LAB — CBC
HEMATOCRIT: 35.7 % — AB (ref 36.0–46.0)
Hemoglobin: 12 g/dL (ref 12.0–15.0)
MCH: 30.4 pg (ref 26.0–34.0)
MCHC: 33.6 g/dL (ref 30.0–36.0)
MCV: 90.4 fL (ref 78.0–100.0)
Platelets: 378 10*3/uL (ref 150–400)
RBC: 3.95 MIL/uL (ref 3.87–5.11)
RDW: 12.7 % (ref 11.5–15.5)
WBC: 9.1 10*3/uL (ref 4.0–10.5)

## 2015-05-22 LAB — COMPREHENSIVE METABOLIC PANEL
ALBUMIN: 3.8 g/dL (ref 3.5–5.0)
ALK PHOS: 69 U/L (ref 38–126)
ALT: 14 U/L (ref 14–54)
AST: 14 U/L — AB (ref 15–41)
Anion gap: 9 (ref 5–15)
BUN: 13 mg/dL (ref 6–20)
CALCIUM: 9.1 mg/dL (ref 8.9–10.3)
CHLORIDE: 106 mmol/L (ref 101–111)
CO2: 25 mmol/L (ref 22–32)
Creatinine, Ser: 0.78 mg/dL (ref 0.44–1.00)
GFR calc Af Amer: >60 mL/min (ref >60)
GFR calc non Af Amer: >60 mL/min (ref >60)
Glucose, Bld: 102 mg/dL — ABNORMAL HIGH (ref 65–99)
Potassium: 3.7 mmol/L (ref 3.5–5.1)
Sodium: 140 mmol/L (ref 135–145)
Total Bilirubin: 0.1 mg/dL — ABNORMAL LOW (ref 0.3–1.2)
Total Protein: 7.6 g/dL (ref 6.5–8.1)

## 2015-05-22 LAB — LIPASE, BLOOD: Lipase: 17 U/L (ref 11–51)

## 2015-05-22 NOTE — ED Notes (Signed)
Patient presents for right side abdominal pain radiating to mid lower abdominal pain and mid lower back pain x1 hour. Denies urinary symptoms, last BM yesterday normal for patient, denies N/V/D, fever or chills. Reports clear discharge.

## 2015-05-23 LAB — WET PREP, GENITAL
Sperm: NONE SEEN
TRICH WET PREP: NONE SEEN
YEAST WET PREP: NONE SEEN

## 2015-05-23 LAB — PREGNANCY, URINE: Preg Test, Ur: NEGATIVE

## 2015-05-23 LAB — URINALYSIS, ROUTINE W REFLEX MICROSCOPIC
Bilirubin Urine: NEGATIVE
GLUCOSE, UA: NEGATIVE mg/dL
Hgb urine dipstick: NEGATIVE
Ketones, ur: NEGATIVE mg/dL
LEUKOCYTES UA: NEGATIVE
Nitrite: NEGATIVE
Protein, ur: NEGATIVE mg/dL
SPECIFIC GRAVITY, URINE: 1.038 — AB (ref 1.005–1.030)
pH: 6.5 (ref 5.0–8.0)

## 2015-05-23 LAB — GC/CHLAMYDIA PROBE AMP (~~LOC~~) NOT AT ARMC
Chlamydia: NEGATIVE
NEISSERIA GONORRHEA: NEGATIVE

## 2015-05-23 MED ORDER — IBUPROFEN 800 MG PO TABS: 800.0000 mg | ORAL_TABLET | Freq: Three times a day (TID) | ORAL | Status: DC

## 2015-05-23 NOTE — ED Notes (Signed)
PA at bedside.

## 2015-05-23 NOTE — Discharge Instructions (Signed)

## 2015-05-23 NOTE — ED Provider Notes (Signed)
CSN: [id number]     Arrival date & time 05/22/15  2217 History  By signing my name below, I, Crystal Carrillo, attest that this documentation has been prepared under the direction and in the presence of [name] PA-C.[name]. Electronically Signed: Bethel BornBritney Carrillo, ED Scribe. 05/23/2015 1:20 AM    Chief Complaint  Patient presents with  . Abdominal Pain    The history is provided by the patient. No language interpreter was used.   Crystal Carrillo is a 20 y.o. female with history of ovarian cysts who presents to the Emergency Department complaining of constant, 7/10 in severity, lower abdominal pain with sudden onset approximately 4 hours ago. The pain started at the right side of the abdomen before moving to the lower abdomen. She notes that the pain radiates to the back and is worse with movement. Pt took nothing for pain at home.  Associated symptoms include vaginal bleeding (spotting). She notes that her last menstrual period was last month and that she may be pregnant.  Pt denies abnormal vaginal discharge, dysuria, increased frequency, constipation, diarrhea, SOB, and chest pain. PCP is [name].   Past Medical History  Diagnosis Date  . Headache(784.0)    History reviewed. No pertinent past surgical history. Family History  Problem Relation Age of Onset  . Migraines Mother   . Hypertension Mother   . Diabetes Mother   . Asthma Brother   . Migraines Maternal Aunt    Social History  Substance Use Topics  . Smoking status: Current Some Day Smoker  . Smokeless tobacco: Never Used  . Alcohol Use: Yes   OB History    Gravida Para Term Preterm AB TAB SAB Ectopic Multiple Living   0 0 0            Review of Systems  Constitutional: Negative for fever.  Respiratory: Negative for shortness of breath.   Cardiovascular: Negative for chest pain.  Gastrointestinal: Positive for abdominal pain. Negative for diarrhea and constipation.  Genitourinary: Positive for vaginal bleeding.  Negative for dysuria, frequency, vaginal discharge and difficulty urinating.  Musculoskeletal: Negative for myalgias.  Neurological: Negative for weakness and light-headedness.   Allergies  Bee venom  Home Medications   Prior to Admission medications   Medication Sig Start Date End Date Taking? Authorizing Provider  bismuth subsalicylate (PEPTO BISMOL) 262 MG/15ML suspension Take 10 mLs by mouth every 6 (six) hours as needed for indigestion or diarrhea or loose stools.    Historical Provider, MD  docusate sodium (COLACE) 100 MG capsule Take 1 capsule (100 mg total) by mouth 2 (two) times daily as needed for mild constipation or moderate constipation. 12/12/13   [name], MD  doxycycline (VIBRAMYCIN) 100 MG capsule Take 1 capsule (100 mg total) by mouth 2 (two) times daily. Patient not taking: Reported on 12/12/2013 01/26/12   Dahlia ClientHannah Muthersbaugh, PA-C  HYDROcodone-acetaminophen (NORCO/VICODIN) 5-325 MG per tablet Take 1 tablet by mouth every 4 (four) hours as needed. 12/07/13   [name], MD  ibuprofen (ADVIL,MOTRIN) 200 MG tablet Take 600 mg by mouth every 6 (six) hours as needed for headache (headache). For pain    Historical Provider, MD  ondansetron (ZOFRAN) 4 MG tablet Take 1 tablet (4 mg total) by mouth every 6 (six) hours. Patient not taking: Reported on 12/12/2013 01/26/12   Dahlia ClientHannah Muthersbaugh, PA-C  oxyCODONE-acetaminophen (PERCOCET) 10-650 MG per tablet Take 1 tablet by mouth every 6 (six) hours as needed for pain. Patient not taking: Reported on 12/12/2013  01/26/12   [name], PA-C  penicillin v potassium (VEETID) 500 MG tablet Take 1 tablet (500 mg total) by mouth 4 (four) times daily. 12/07/13   Rolland Porter, MD  phenylephrine-shark liver oil-mineral oil-petrolatum (PREPARATION H) 0.25-3-14-71.9 % rectal ointment Place 1 application rectally 2 (two) times daily as needed for hemorrhoids.    Historical Provider, MD  predniSONE (DELTASONE) 10 MG tablet Take 2 tablets  (20 mg total) by mouth daily. 12/07/13   Rolland Porter, MD  Sennosides (EX-LAX) 15 MG TABS Take 2 tablets by mouth once.    Historical Provider, MD  sulfamethoxazole-trimethoprim (SEPTRA DS) 800-160 MG per tablet Take 1 tablet by mouth every 12 (twelve) hours. 12/12/13   Blake Divine, MD  Witch Hazel (PREPARATION H FOR WOMEN) 20 % PADS Apply 1 application topically every 6 (six) hours as needed (for constipation).    Historical Provider, MD   BP 128/89 mmHg  Pulse 85  Temp(Src) 98.5 F (36.9 C) (Oral)  Resp 16  SpO2 98%  LMP 04/15/2015 Physical Exam  Constitutional: She is oriented to person, place, and time. She appears well-developed and well-nourished. No distress.  HENT:  Head: Normocephalic and atraumatic.  Eyes: Conjunctivae and EOM are normal.  Neck: Neck supple. No tracheal deviation present.  Cardiovascular: Normal rate.   Pulmonary/Chest: Effort normal. No respiratory distress.  Abdominal: Soft.  Tender to lower abdomen. Soft, without mass or distention.  Genitourinary:  No cervical discharge. No CMT, adnexal mass or tenderness. There is no cervical bleeding present.   Musculoskeletal: Normal range of motion.  Neurological: She is alert and oriented to person, place, and time.  Skin: Skin is warm and dry.  Psychiatric: She has a normal mood and affect. Her behavior is normal.  Nursing note and vitals reviewed.   ED Course  Procedures (including critical care time) DIAGNOSTIC STUDIES: Oxygen Saturation is 98% on RA,  normal by my interpretation.    COORDINATION OF CARE: 1:10 AM Discussed treatment plan which includes pelvic exam and lab work with pt at bedside and pt agreed to plan.  Labs Review Labs Reviewed  COMPREHENSIVE METABOLIC PANEL - Abnormal; Notable for the following:    Glucose, Bld 102 (*)    AST 14 (*)    Total Bilirubin 0.1 (*)    All other components within normal limits  CBC - Abnormal; Notable for the following:    HCT 35.7 (*)    All other  components within normal limits  URINALYSIS, ROUTINE W REFLEX MICROSCOPIC (NOT AT [facility]) - Abnormal; Notable for the following:    APPearance CLOUDY (*)    Specific Gravity, Urine 1.038 (*)    All other components within normal limits  LIPASE, BLOOD  PREGNANCY, URINE   Results for orders placed or performed during the hospital encounter of 05/22/15  Wet prep, genital  Result Value Ref Range   Yeast Wet Prep HPF POC NONE SEEN NONE SEEN   Trich, Wet Prep NONE SEEN NONE SEEN   Clue Cells Wet Prep HPF POC PRESENT (A) NONE SEEN   WBC, Wet Prep HPF POC FEW (A) NONE SEEN   Sperm NONE SEEN   Lipase, blood  Result Value Ref Range   Lipase 17 11 - 51 U/L  Comprehensive metabolic panel  Result Value Ref Range   Sodium 140 135 - 145 mmol/L   Potassium 3.7 3.5 - 5.1 mmol/L   Chloride 106 101 - 111 mmol/L   CO2 25 22 - 32 mmol/L   Glucose, Bld  102 (H) 65 - 99 mg/dL   BUN 13 6 - 20 mg/dL   Creatinine, Ser 1.61 0.44 - 1.00 mg/dL   Calcium 9.1 8.9 - 09.6 mg/dL   Total Protein 7.6 6.5 - 8.1 g/dL   Albumin 3.8 3.5 - 5.0 g/dL   AST 14 (L) 15 - 41 U/L   ALT 14 14 - 54 U/L   Alkaline Phosphatase 69 38 - 126 U/L   Total Bilirubin 0.1 (L) 0.3 - 1.2 mg/dL   GFR calc non Af Amer >60 >60 mL/min   GFR calc Af Amer >60 >60 mL/min   Anion gap 9 5 - 15  CBC  Result Value Ref Range   WBC 9.1 4.0 - 10.5 K/uL   RBC 3.95 3.87 - 5.11 MIL/uL   Hemoglobin 12.0 12.0 - 15.0 g/dL   HCT 04.5 (L) 40.9 - 81.1 %   MCV 90.4 78.0 - 100.0 fL   MCH 30.4 26.0 - 34.0 pg   MCHC 33.6 30.0 - 36.0 g/dL   RDW 91.4 78.2 - 95.6 %   Platelets 378 150 - 400 K/uL  Urinalysis, Routine w reflex microscopic (not at [facility])  Result Value Ref Range   Color, Urine YELLOW YELLOW   APPearance CLOUDY (A) CLEAR   Specific Gravity, Urine 1.038 (H) 1.005 - 1.030   pH 6.5 5.0 - 8.0   Glucose, UA NEGATIVE NEGATIVE mg/dL   Hgb urine dipstick NEGATIVE NEGATIVE   Bilirubin Urine NEGATIVE NEGATIVE   Ketones, ur NEGATIVE NEGATIVE mg/dL    Protein, ur NEGATIVE NEGATIVE mg/dL   Nitrite NEGATIVE NEGATIVE   Leukocytes, UA NEGATIVE NEGATIVE  Pregnancy, urine  Result Value Ref Range   Preg Test, Ur NEGATIVE NEGATIVE  GC/Chlamydia probe amp  Result Value Ref Range   Chlamydia Negative    Neisseria gonorrhea Negative     Imaging Review No results found. I have personally reviewed and evaluated these lab results as part of my medical decision-making.   EKG Interpretation None      MDM   Final diagnoses:  None    1. Irregular menses  The patient arrives for abdominal pain x 4 hours without attempt at pain relief at home prior to arrival. She appears non-toxic and in NAD. She provides the history of abnormal menstrual bleeding but no discharge, dysuria. There has been no fever.   Exam is unremarkable. VSS. No leukocytosis or concerning lab finding. Suspect symptoms related to irregular menses and GYN follow up is recommended.  I personally performed the services described in this documentation, which was scribed in my presence. The recorded information has been reviewed and is accurate.     Elpidio Anis, PA-C 05/24/15 2203  Derwood Kaplan, MD 05/24/15 2211

## 2015-06-19 ENCOUNTER — Other Ambulatory Visit: Payer: Self-pay | Admitting: Physician Assistant

## 2015-06-19 DIAGNOSIS — R109 Unspecified abdominal pain: Secondary | ICD-10-CM

## 2015-06-20 ENCOUNTER — Other Ambulatory Visit: Payer: Self-pay

## 2016-02-17 ENCOUNTER — Inpatient Hospital Stay (HOSPITAL_COMMUNITY)
Admission: AD | Admit: 2016-02-17 | Discharge: 2016-02-18 | Disposition: A | Discharge status: Home / Self Care | Payer: Medicaid Other | Source: Ambulatory Visit | Attending: Obstetrics and Gynecology | Admitting: Obstetrics and Gynecology

## 2016-02-17 ENCOUNTER — Encounter (HOSPITAL_COMMUNITY): Payer: Self-pay

## 2016-02-17 DIAGNOSIS — N760 Acute vaginitis: Secondary | ICD-10-CM | POA: Diagnosis not present

## 2016-02-17 DIAGNOSIS — O99332 Smoking (tobacco) complicating pregnancy, second trimester: Secondary | ICD-10-CM | POA: Diagnosis not present

## 2016-02-17 DIAGNOSIS — O209 Hemorrhage in early pregnancy, unspecified: Secondary | ICD-10-CM | POA: Diagnosis not present

## 2016-02-17 DIAGNOSIS — Z3A14 14 weeks gestation of pregnancy: Secondary | ICD-10-CM | POA: Insufficient documentation

## 2016-02-17 DIAGNOSIS — Z79899 Other long term (current) drug therapy: Secondary | ICD-10-CM | POA: Insufficient documentation

## 2016-02-17 DIAGNOSIS — N939 Abnormal uterine and vaginal bleeding, unspecified: Secondary | ICD-10-CM | POA: Diagnosis present

## 2016-02-17 DIAGNOSIS — B9689 Other specified bacterial agents as the cause of diseases classified elsewhere: Secondary | ICD-10-CM

## 2016-02-17 DIAGNOSIS — N930 Postcoital and contact bleeding: Secondary | ICD-10-CM

## 2016-02-17 LAB — URINALYSIS, ROUTINE W REFLEX MICROSCOPIC
BACTERIA UA: NONE SEEN
BILIRUBIN URINE: NEGATIVE
GLUCOSE, UA: NEGATIVE mg/dL
KETONES UR: NEGATIVE mg/dL
Leukocytes, UA: NEGATIVE
NITRITE: NEGATIVE
PROTEIN: NEGATIVE mg/dL
Specific Gravity, Urine: 1.029 (ref 1.005–1.030)
pH: 6 (ref 5.0–8.0)

## 2016-02-17 LAB — WET PREP, GENITAL
Sperm: NONE SEEN
Trich, Wet Prep: NONE SEEN
Yeast Wet Prep HPF POC: NONE SEEN

## 2016-02-17 LAB — POCT PREGNANCY, URINE: PREG TEST UR: POSITIVE — AB

## 2016-02-17 NOTE — MAU Provider Note (Signed)
History     CSN: [id number]  Arrival date and time: 02/17/16 2234   First Provider Initiated Contact with Patient 02/17/16 2303      Chief Complaint  Patient presents with  . Vaginal Bleeding   Walker ShadowLataja S Arps is a 21 y.o. G1P0 at 3161w0d presenting with spotting. After antecedent intercourse, she noticed pink streaking on the toilet paper starting at 0100. It continued throughout the day but was never enough to where of peri-pad. This is her first episode of vaginal bleeding. Denies vaginal irritation or itching. States cultures done in the office 2 weeks ago were negative. Prenatal care with Dr. Dion BodyVarnado, uncomplicated to date.       Past Medical History:  Diagnosis Date  . Headache(784.0)     No past surgical history on file.  Family History  Problem Relation Age of Onset  . Migraines Mother   . Hypertension Mother   . Diabetes Mother   . Asthma Brother   . Migraines Maternal Aunt     Social History  Substance Use Topics  . Smoking status: Current Some Day Smoker  . Smokeless tobacco: Never Used  . Alcohol use Yes    Allergies:  Allergies  Allergen Reactions  . Bee Venom Hives    Prescriptions Prior to Admission  Medication Sig Dispense Refill Last Dose  . cetirizine-pseudoephedrine (ZYRTEC-D) 5-120 MG tablet Take 1 tablet by mouth 2 (two) times daily as needed for allergies.   05/20/2015  . docusate sodium (COLACE) 100 MG capsule Take 1 capsule (100 mg total) by mouth 2 (two) times daily as needed for mild constipation or moderate constipation. (Patient not taking: Reported on 05/23/2015) 20 capsule 0 Not Taking at Unknown time  . ibuprofen (ADVIL,MOTRIN) 800 MG tablet Take 1 tablet (800 mg total) by mouth 3 (three) times daily. 21 tablet 0   . RECLIPSEN 0.15-30 MG-MCG tablet Take 1 tablet by mouth daily.  12 05/06/2015    Review of Systems  Gastrointestinal: Negative for abdominal pain.  Genitourinary: Positive for vaginal bleeding. Negative for dyspareunia,  dysuria, genital sores, pelvic pain, vaginal discharge and vaginal pain.  Musculoskeletal: Negative for back pain.   Physical Exam   Blood pressure 126/76, pulse 74, temperature 98.5 F (36.9 C), temperature source Oral, resp. rate 18, height 5\' 6"  (1.676 m), weight 115.6 kg (254 lb 12 oz), last menstrual period 11/11/2015.   Physical Exam  Nursing note and vitals reviewed. Constitutional: She appears well-developed and well-nourished. No distress.  HENT:  Head: Normocephalic.  Eyes: Pupils are equal, round, and reactive to light.  Neck: Normal range of motion.  GI: Soft. There is no tenderness.  S=D. DT FHR 150  Genitourinary: Vagina normal.  Genitourinary Comments: NEFG. Perineum dry Spec: scant pink-tinged white discharge in vault. Cx nulliparous and clean, no active bleeding. VE: cx posterior, firm, long, closed.   Musculoskeletal: Normal range of motion.  Neurological: She is alert.  Skin: Skin is warm and dry.    MAU Course  Procedures Results for orders placed or performed during the hospital encounter of 02/17/16 (from the past 24 hour(s))  Urinalysis, Routine w reflex microscopic     Status: Abnormal   Collection Time: 02/17/16 10:47 PM  Result Value Ref Range   Color, Urine YELLOW YELLOW   APPearance CLEAR CLEAR   Specific Gravity, Urine 1.029 1.005 - 1.030   pH 6.0 5.0 - 8.0   Glucose, UA NEGATIVE NEGATIVE mg/dL   Hgb urine dipstick SMALL (A) NEGATIVE  Bilirubin Urine NEGATIVE NEGATIVE   Ketones, ur NEGATIVE NEGATIVE mg/dL   Protein, ur NEGATIVE NEGATIVE mg/dL   Nitrite NEGATIVE NEGATIVE   Leukocytes, UA NEGATIVE NEGATIVE   RBC / HPF 0-5 0 - 5 RBC/hpf   WBC, UA 0-5 0 - 5 WBC/hpf   Bacteria, UA NONE SEEN NONE SEEN   Squamous Epithelial / LPF 0-5 (A) NONE SEEN   Mucous PRESENT   Pregnancy, urine POC     Status: Abnormal   Collection Time: 02/17/16 10:56 PM  Result Value Ref Range   Preg Test, Ur POSITIVE (A) NEGATIVE  Wet prep, genital     Status:  Abnormal   Collection Time: 02/17/16 11:12 PM  Result Value Ref Range   Yeast Wet Prep HPF POC NONE SEEN NONE SEEN   Trich, Wet Prep NONE SEEN NONE SEEN   Clue Cells Wet Prep HPF POC PRESENT (A) NONE SEEN   WBC, Wet Prep HPF POC FEW (A) NONE SEEN   Sperm NONE SEEN    PN record reviewed: O positive  C/W Dr. Su Hilt will treat BV  Assessment and Plan   1. BV (bacterial vaginosis)   2. Postcoital bleeding    Allergies as of 02/18/2016      Reactions   Bee Venom Hives      Medication List    STOP taking these medications   cetirizine-pseudoephedrine 5-120 MG tablet Commonly known as:  ZYRTEC-D   docusate sodium 100 MG capsule Commonly known as:  COLACE   ibuprofen 800 MG tablet Commonly known as:  ADVIL,MOTRIN   RECLIPSEN 0.15-30 MG-MCG tablet Generic drug:  desogestrel-ethinyl estradiol     TAKE these medications   metroNIDAZOLE 500 MG tablet Commonly known as:  FLAGYL Take 1 tablet (500 mg total) by mouth 2 (two) times daily.        Monik Lins CNM 02/17/2016, 11:04 PM

## 2016-02-17 NOTE — MAU Note (Signed)
PT  SAYS   TODAY AT    0100- WENT TO  B-ROOM  - WHEN  SHE  WIPED    SAW   SPECTS  OF  BLOOD.    NO CRAMPING.     LAST SEX-   Sunday.    LAST SEEN AT DR OFFICE   3 WEEKS  AGO.

## 2016-02-18 DIAGNOSIS — N760 Acute vaginitis: Secondary | ICD-10-CM

## 2016-02-18 DIAGNOSIS — B9689 Other specified bacterial agents as the cause of diseases classified elsewhere: Secondary | ICD-10-CM

## 2016-02-18 MED ORDER — METRONIDAZOLE 500 MG PO TABS: 500.0000 mg | ORAL_TABLET | Freq: Two times a day (BID) | ORAL | 0 refills | Status: AC

## 2016-02-18 NOTE — Discharge Instructions (Signed)
Pelvic Rest Introduction Pelvic rest may be recommended if:  Your placenta is partially or completely covering the opening of your cervix (placenta previa).  There is bleeding between the wall of the uterus and the amniotic sac in the first trimester of pregnancy (subchorionic hemorrhage).  You went into labor too early (preterm labor). Based on your overall health and the health of your baby, your health care provider will decide if pelvic rest is right for you. How do I rest my pelvis? For as long as told by your health care provider:  Do not have sex, sexual stimulation, or an orgasm.  Do not use tampons. Do not douche. Do not put anything in your vagina.  Do not lift anything that is heavier than 10 lb (4.5 kg).  Avoid activities that take a lot of effort (are strenuous).  Avoid any activity in which your pelvic muscles could become strained. When should I seek medical care? Seek medical care if you have:  Cramping pain in your lower abdomen.  Vaginal discharge.  A low, dull backache.  Regular contractions.  Uterine tightening. When should I seek immediate medical care? Seek immediate medical care if:  You have vaginal bleeding and you are pregnant. This information is not intended to replace advice given to you by your health care provider. Make sure you discuss any questions you have with your health care provider. Document Released: 05/09/2010 Document Revised: 06/20/2015 Document Reviewed: 07/16/2014  2017 Elsevier Bacterial Vaginosis Bacterial vaginosis is a vaginal infection that occurs when the normal balance of bacteria in the vagina is disrupted. It results from an overgrowth of certain bacteria. This is the most common vaginal infection among women ages 65-44. Because bacterial vaginosis increases your risk for STIs (sexually transmitted infections), getting treated can help reduce your risk for chlamydia, gonorrhea, herpes, and HIV (human immunodeficiency  virus). Treatment is also important for preventing complications in pregnant women, because this condition can cause an early (premature) delivery. What are the causes? This condition is caused by an increase in harmful bacteria that are normally present in small amounts in the vagina. However, the reason that the condition develops is not fully understood. What increases the risk? The following factors may make you more likely to develop this condition:  Having a new sexual partner or multiple sexual partners.  Having unprotected sex.  Douching.  Having an intrauterine device (IUD).  Smoking.  Drug and alcohol abuse.  Taking certain antibiotic medicines.  Being pregnant. You cannot get bacterial vaginosis from toilet seats, bedding, swimming pools, or contact with objects around you. What are the signs or symptoms? Symptoms of this condition include:  Grey or white vaginal discharge. The discharge can also be watery or foamy.  A fish-like odor with discharge, especially after sexual intercourse or during menstruation.  Itching in and around the vagina.  Burning or pain with urination. Some women with bacterial vaginosis have no signs or symptoms. How is this diagnosed? This condition is diagnosed based on:  Your medical history.  A physical exam of the vagina.  Testing a sample of vaginal fluid under a microscope to look for a large amount of bad bacteria or abnormal cells. Your health care provider may use a cotton swab or a small wooden spatula to collect the sample. How is this treated? This condition is treated with antibiotics. These may be given as a pill, a vaginal cream, or a medicine that is put into the vagina (suppository). If the condition comes back  after treatment, a second round of antibiotics may be needed. Follow these instructions at home: Medicines  Take over-the-counter and prescription medicines only as told by your health care provider.  Take or  use your antibiotic as told by your health care provider. Do not stop taking or using the antibiotic even if you start to feel better. General instructions  If you have a female sexual partner, tell her that you have a vaginal infection. She should see her health care provider and be treated if she has symptoms. If you have a female sexual partner, he does not need treatment.  During treatment:  Avoid sexual activity until you finish treatment.  Do not douche.  Avoid alcohol as directed by your health care provider.  Avoid breastfeeding as directed by your health care provider.  Drink enough water and fluids to keep your urine clear or pale yellow.  Keep the area around your vagina and rectum clean.  Wash the area daily with warm water.  Wipe yourself from front to back after using the toilet.  Keep all follow-up visits as told by your health care provider. This is important. How is this prevented?  Do not douche.  Wash the outside of your vagina with warm water only.  Use protection when having sex. This includes latex condoms and dental dams.  Limit how many sexual partners you have. To help prevent bacterial vaginosis, it is best to have sex with just one partner (monogamous).  Make sure you and your sexual partner are tested for STIs.  Wear cotton or cotton-lined underwear.  Avoid wearing tight pants and pantyhose, especially during summer.  Limit the amount of alcohol that you drink.  Do not use any products that contain nicotine or tobacco, such as cigarettes and e-cigarettes. If you need help quitting, ask your health care provider.  Do not use illegal drugs. Where to find more information:  Centers for Disease Control and Prevention: SolutionApps.co.zawww.cdc.gov/std  American Sexual Health Association (ASHA): www.ashastd.org  U.S. Department of Health and Health and safety inspectorHuman Services, Office on Women's Health: ConventionalMedicines.siwww.womenshealth.gov/ or  http://www.anderson-williamson.info/https://www.womenshealth.gov/a-z-topics/bacterial-vaginosis Contact a health care provider if:  Your symptoms do not improve, even after treatment.  You have more discharge or pain when urinating.  You have a fever.  You have pain in your abdomen.  You have pain during sex.  You have vaginal bleeding between periods. Summary  Bacterial vaginosis is a vaginal infection that occurs when the normal balance of bacteria in the vagina is disrupted.  Because bacterial vaginosis increases your risk for STIs (sexually transmitted infections), getting treated can help reduce your risk for chlamydia, gonorrhea, herpes, and HIV (human immunodeficiency virus). Treatment is also important for preventing complications in pregnant women, because the condition can cause an early (premature) delivery.  This condition is treated with antibiotic medicines. These may be given as a pill, a vaginal cream, or a medicine that is put into the vagina (suppository). This information is not intended to replace advice given to you by your health care provider. Make sure you discuss any questions you have with your health care provider. Document Released: 01/12/2005 Document Revised: 09/28/2015 Document Reviewed: 09/28/2015 Elsevier Interactive Patient Education  2017 [employer]

## 2016-02-21 ENCOUNTER — Encounter (HOSPITAL_COMMUNITY): Payer: Self-pay

## 2016-02-21 ENCOUNTER — Inpatient Hospital Stay (HOSPITAL_COMMUNITY)
Admission: AD | Admit: 2016-02-21 | Discharge: 2016-02-22 | Disposition: A | Discharge status: Home / Self Care | Payer: Medicaid Other | Source: Ambulatory Visit | Attending: Obstetrics and Gynecology | Admitting: Obstetrics and Gynecology

## 2016-02-21 DIAGNOSIS — O30042 Twin pregnancy, dichorionic/diamniotic, second trimester: Secondary | ICD-10-CM | POA: Diagnosis not present

## 2016-02-21 DIAGNOSIS — O4692 Antepartum hemorrhage, unspecified, second trimester: Secondary | ICD-10-CM | POA: Insufficient documentation

## 2016-02-21 DIAGNOSIS — O4690 Antepartum hemorrhage, unspecified, unspecified trimester: Secondary | ICD-10-CM

## 2016-02-21 DIAGNOSIS — N760 Acute vaginitis: Secondary | ICD-10-CM | POA: Diagnosis not present

## 2016-02-21 DIAGNOSIS — Z3A14 14 weeks gestation of pregnancy: Secondary | ICD-10-CM | POA: Insufficient documentation

## 2016-02-21 DIAGNOSIS — O209 Hemorrhage in early pregnancy, unspecified: Secondary | ICD-10-CM | POA: Diagnosis present

## 2016-02-21 DIAGNOSIS — B9689 Other specified bacterial agents as the cause of diseases classified elsewhere: Secondary | ICD-10-CM | POA: Diagnosis not present

## 2016-02-21 LAB — URINALYSIS, ROUTINE W REFLEX MICROSCOPIC
BACTERIA UA: NONE SEEN
BILIRUBIN URINE: NEGATIVE
Glucose, UA: NEGATIVE mg/dL
KETONES UR: NEGATIVE mg/dL
LEUKOCYTES UA: NEGATIVE
NITRITE: NEGATIVE
Protein, ur: 30 mg/dL — AB
SPECIFIC GRAVITY, URINE: 1.024 (ref 1.005–1.030)
pH: 6 (ref 5.0–8.0)

## 2016-02-21 NOTE — MAU Note (Signed)
Was seen here Monday for bleeding and it stopped after 24hrs. Tonight went to [facility] and wiped and had pinkish /red on tissue and on panties. Some mild lower back pain. No sex since initial bleeding on Monday

## 2016-02-22 ENCOUNTER — Inpatient Hospital Stay (HOSPITAL_COMMUNITY): Payer: Medicaid Other

## 2016-02-22 DIAGNOSIS — N760 Acute vaginitis: Secondary | ICD-10-CM | POA: Diagnosis not present

## 2016-02-22 DIAGNOSIS — B9689 Other specified bacterial agents as the cause of diseases classified elsewhere: Secondary | ICD-10-CM

## 2016-02-22 LAB — CBC
HEMATOCRIT: 30.6 % — AB (ref 36.0–46.0)
Hemoglobin: 10.8 g/dL — ABNORMAL LOW (ref 12.0–15.0)
MCH: 32 pg (ref 26.0–34.0)
MCHC: 35.3 g/dL (ref 30.0–36.0)
MCV: 90.5 fL (ref 78.0–100.0)
PLATELETS: 310 10*3/uL (ref 150–400)
RBC: 3.38 MIL/uL — ABNORMAL LOW (ref 3.87–5.11)
RDW: 13.1 % (ref 11.5–15.5)
WBC: 7.7 10*3/uL (ref 4.0–10.5)

## 2016-02-22 LAB — WET PREP, GENITAL
SPERM: NONE SEEN
TRICH WET PREP: NONE SEEN
Yeast Wet Prep HPF POC: NONE SEEN

## 2016-02-22 LAB — ABO/RH: ABO/RH(D): O POS

## 2016-02-22 NOTE — MAU Provider Note (Signed)
Chief Complaint: Vaginal Bleeding   None     SUBJECTIVE HPI: Crystal Carrillo is a 21 y.o. G1P0000 at [redacted]w[redacted]d by LMP who presents to maternity admissions reporting vaginal spotting intermittently starting today. This is a recurring problem as she was seen on 1/22 in MAU for light bleeding following intercourse. She denies intercourse since that date but bleeding started again today. She denies any pain or other associated symptoms.  She has not tried any treatments, nothing makes it better or worse. She was prescribed Flagyl on 1/22 for BV but has not picked up the Rx. She denies abdominal pain, vaginal itching/burning, urinary symptoms, h/a, dizziness, n/v, or fever/chills.     HPI  Past Medical History:  Diagnosis Date  . Headache(784.0)   . Medical history non-contributory    Past Surgical History:  Procedure Laterality Date  . NO PAST SURGERIES     Social History   Social History  . Marital status: Single    Spouse name: N/A  . Number of children: N/A  . Years of education: N/A   Occupational History  . Not on file.   Social History Main Topics  . Smoking status: Never Smoker  . Smokeless tobacco: Never Used  . Alcohol use No  . Drug use: Yes    Types: Marijuana  . Sexual activity: Yes    Birth control/ protection: Condom   Other Topics Concern  . Not on file   Social History Narrative  . No narrative on file   No current facility-administered medications on file prior to encounter.    Current Outpatient Prescriptions on File Prior to Encounter  Medication Sig Dispense Refill  . metroNIDAZOLE (FLAGYL) 500 MG tablet Take 1 tablet (500 mg total) by mouth 2 (two) times daily. 14 tablet 0   Allergies  Allergen Reactions  . Bee Venom Hives    ROS:  Review of Systems  Constitutional: Negative for chills, fatigue and fever.  Respiratory: Negative for shortness of breath.   Cardiovascular: Negative for chest pain.  Genitourinary: Positive for vaginal bleeding.  Negative for difficulty urinating, dysuria, flank pain, pelvic pain, vaginal discharge and vaginal pain.  Neurological: Negative for dizziness and headaches.  Psychiatric/Behavioral: Negative.      I have reviewed patient's Past Medical Hx, Surgical Hx, Family Hx, Social Hx, medications and allergies.   Physical Exam   Patient Vitals for the past 24 hrs:  BP Temp Pulse Resp Height Weight  02/22/16 0208 124/75 97.7 F (36.5 C) 79 18 - -  02/21/16 2208 131/72 97.5 F (36.4 C) 81 18 5\' 6"  (1.676 m) 256 lb 3.2 oz (116.2 kg)   Constitutional: Well-developed, well-nourished female in no acute distress.  Cardiovascular: normal rate Respiratory: normal effort GI: Abd soft, non-tender. Pos BS x 4 MS: Extremities nontender, no edema, normal ROM Neurologic: Alert and oriented x 4.  GU: Neg CVAT.  PELVIC EXAM: Cervix pink, visually closed, without lesion, scant light brown bleeding, vaginal walls and external genitalia normal Bimanual exam: Cervix 0/long/high, firm, anterior  FHT 145/159 by doppler  LAB RESULTS Results for orders placed or performed during the hospital encounter of 02/21/16 (from the past 24 hour(s))  Urinalysis, Routine w reflex microscopic     Status: Abnormal   Collection Time: 02/21/16 10:20 PM  Result Value Ref Range   Color, Urine YELLOW YELLOW   APPearance HAZY (A) CLEAR   Specific Gravity, Urine 1.024 1.005 - 1.030   pH 6.0 5.0 - 8.0   Glucose, UA  NEGATIVE NEGATIVE mg/dL   Hgb urine dipstick MODERATE (A) NEGATIVE   Bilirubin Urine NEGATIVE NEGATIVE   Ketones, ur NEGATIVE NEGATIVE mg/dL   Protein, ur 30 (A) NEGATIVE mg/dL   Nitrite NEGATIVE NEGATIVE   Leukocytes, UA NEGATIVE NEGATIVE   RBC / HPF 0-5 0 - 5 RBC/hpf   WBC, UA 0-5 0 - 5 WBC/hpf   Bacteria, UA NONE SEEN NONE SEEN   Squamous Epithelial / LPF 6-30 (A) NONE SEEN   Mucous PRESENT   CBC     Status: Abnormal   Collection Time: 02/21/16 11:49 PM  Result Value Ref Range   WBC 7.7 4.0 - 10.5 K/uL    RBC 3.38 (L) 3.87 - 5.11 MIL/uL   Hemoglobin 10.8 (L) 12.0 - 15.0 g/dL   HCT 82.930.6 (L) 56.236.0 - 13.046.0 %   MCV 90.5 78.0 - 100.0 fL   MCH 32.0 26.0 - 34.0 pg   MCHC 35.3 30.0 - 36.0 g/dL   RDW 86.513.1 78.411.5 - 69.615.5 %   Platelets 310 150 - 400 K/uL  ABO/Rh     Status: None   Collection Time: 02/21/16 11:49 PM  Result Value Ref Range   ABO/RH(D) O POS   Wet prep, genital     Status: Abnormal   Collection Time: 02/22/16  1:52 AM  Result Value Ref Range   Yeast Wet Prep HPF POC NONE SEEN NONE SEEN   Trich, Wet Prep NONE SEEN NONE SEEN   Clue Cells Wet Prep HPF POC PRESENT (A) NONE SEEN   WBC, Wet Prep HPF POC FEW (A) NONE SEEN   Sperm NONE SEEN     --/--/O POS (01/26 2349)  IMAGING No results found.  MAU Management/MDM: Ordered labs and US and reviewed results.  Normal IUP x 2 with no placental abnormalities.  Consult Dr Senaida Oresichardson.  Recollect wet prep. Treat for BV and pt to f/u in office next week.  Pt stable at time of discharge.  ASSESSMENT 1. Bacterial vaginosis   2. Vaginal bleeding in pregnancy   3. Vaginal bleeding in pregnancy, second trimester   4. Dichorionic diamniotic twin pregnancy in second trimester     PLAN Discharge home with bleeding precautions  Allergies as of 02/22/2016      Reactions   Bee Venom Hives      Medication List    ASK your doctor about these medications   metroNIDAZOLE 500 MG tablet Commonly known as:  FLAGYL Take 1 tablet (500 mg total) by mouth 2 (two) times daily.   prenatal multivitamin Tabs tablet Take 1 tablet by mouth daily at 12 noon.      Follow-up Information    Geryl RankinsVARNADO, EVELYN, MD Follow up.   Specialty:  Obstetrics and Gynecology Why:  On Monday as scheduled, return to MAU as needed for emergencies. Contact information: [address] [place] [address] [phone]           Sharen CounterLisa Leftwich-Kirby Certified Nurse-Midwife 02/22/2016  6:33 AM

## 2016-02-25 LAB — GC/CHLAMYDIA PROBE AMP (~~LOC~~) NOT AT ARMC
Chlamydia: NEGATIVE
Neisseria Gonorrhea: NEGATIVE

## 2016-04-01 ENCOUNTER — Inpatient Hospital Stay (HOSPITAL_COMMUNITY): Payer: No Typology Code available for payment source

## 2016-04-01 ENCOUNTER — Inpatient Hospital Stay (HOSPITAL_COMMUNITY)
Admission: AD | Admit: 2016-04-01 | Discharge: 2016-04-01 | Disposition: A | Discharge status: Other Acute Inpatient Hospital | Payer: No Typology Code available for payment source | Source: Ambulatory Visit | Attending: Obstetrics and Gynecology | Admitting: Obstetrics and Gynecology

## 2016-04-01 ENCOUNTER — Encounter (HOSPITAL_COMMUNITY): Payer: Self-pay | Admitting: *Deleted

## 2016-04-01 DIAGNOSIS — O3432 Maternal care for cervical incompetence, second trimester: Secondary | ICD-10-CM

## 2016-04-01 DIAGNOSIS — R109 Unspecified abdominal pain: Secondary | ICD-10-CM | POA: Diagnosis present

## 2016-04-01 DIAGNOSIS — E860 Dehydration: Secondary | ICD-10-CM | POA: Diagnosis not present

## 2016-04-01 DIAGNOSIS — O26892 Other specified pregnancy related conditions, second trimester: Secondary | ICD-10-CM | POA: Diagnosis not present

## 2016-04-01 DIAGNOSIS — O30009 Twin pregnancy, unspecified number of placenta and unspecified number of amniotic sacs, unspecified trimester: Secondary | ICD-10-CM

## 2016-04-01 DIAGNOSIS — O26899 Other specified pregnancy related conditions, unspecified trimester: Secondary | ICD-10-CM

## 2016-04-01 DIAGNOSIS — O99282 Endocrine, nutritional and metabolic diseases complicating pregnancy, second trimester: Secondary | ICD-10-CM | POA: Insufficient documentation

## 2016-04-01 DIAGNOSIS — Z79899 Other long term (current) drug therapy: Secondary | ICD-10-CM | POA: Insufficient documentation

## 2016-04-01 DIAGNOSIS — Z3A20 20 weeks gestation of pregnancy: Secondary | ICD-10-CM | POA: Diagnosis not present

## 2016-04-01 LAB — URINALYSIS, ROUTINE W REFLEX MICROSCOPIC
BACTERIA UA: NONE SEEN
Bilirubin Urine: NEGATIVE
Glucose, UA: 50 mg/dL — AB
HGB URINE DIPSTICK: NEGATIVE
Ketones, ur: 80 mg/dL — AB
Leukocytes, UA: NEGATIVE
Nitrite: NEGATIVE
PROTEIN: 30 mg/dL — AB
Specific Gravity, Urine: 1.024 (ref 1.005–1.030)
pH: 5 (ref 5.0–8.0)

## 2016-04-01 LAB — COMPREHENSIVE METABOLIC PANEL
ALBUMIN: 3.6 g/dL (ref 3.5–5.0)
ALT: 47 U/L (ref 14–54)
AST: 32 U/L (ref 15–41)
Alkaline Phosphatase: 59 U/L (ref 38–126)
Anion gap: 8 (ref 5–15)
BILIRUBIN TOTAL: 0.7 mg/dL (ref 0.3–1.2)
BUN: 7 mg/dL (ref 6–20)
CO2: 25 mmol/L (ref 22–32)
Calcium: 8.8 mg/dL — ABNORMAL LOW (ref 8.9–10.3)
Chloride: 100 mmol/L — ABNORMAL LOW (ref 101–111)
Creatinine, Ser: 0.46 mg/dL (ref 0.44–1.00)
GFR calc Af Amer: >60 mL/min (ref >60)
GFR calc non Af Amer: >60 mL/min (ref >60)
GLUCOSE: 88 mg/dL (ref 65–99)
POTASSIUM: 3.5 mmol/L (ref 3.5–5.1)
SODIUM: 133 mmol/L — AB (ref 135–145)
TOTAL PROTEIN: 7.7 g/dL (ref 6.5–8.1)

## 2016-04-01 LAB — CBC
HEMATOCRIT: 33.6 % — AB (ref 36.0–46.0)
HEMOGLOBIN: 11.3 g/dL — AB (ref 12.0–15.0)
MCH: 31.4 pg (ref 26.0–34.0)
MCHC: 33.6 g/dL (ref 30.0–36.0)
MCV: 93.3 fL (ref 78.0–100.0)
Platelets: 329 10*3/uL (ref 150–400)
RBC: 3.6 MIL/uL — ABNORMAL LOW (ref 3.87–5.11)
RDW: 13.5 % (ref 11.5–15.5)
WBC: 10.3 10*3/uL (ref 4.0–10.5)

## 2016-04-01 LAB — WET PREP, GENITAL
Sperm: NONE SEEN
TRICH WET PREP: NONE SEEN
Yeast Wet Prep HPF POC: NONE SEEN

## 2016-04-01 MED ORDER — DEXTROSE 5 % IN LACTATED RINGERS IV BOLUS
1000.0000 mL | Freq: Once | INTRAVENOUS | Status: AC
  Administered 2016-04-01: 1000 mL via INTRAVENOUS

## 2016-04-01 MED ORDER — METRONIDAZOLE 500 MG PO TABS
500.0000 mg | ORAL_TABLET | Freq: Once | ORAL | Status: AC
  Administered 2016-04-01: 500 mg via ORAL
  Filled 2016-04-01: qty 1

## 2016-04-01 MED ORDER — NIFEDIPINE 10 MG PO CAPS
10.0000 mg | ORAL_CAPSULE | ORAL | Status: DC | PRN
  Administered 2016-04-01 (×3): 10 mg via ORAL
  Filled 2016-04-01 (×3): qty 1

## 2016-04-01 NOTE — Progress Notes (Signed)
Reviewed and agreed with Mid-level note.  Please see note for full history.  In to evaluate pt by 1910.  Intermittent abdominal pain starting at 0730-1530 prior to going to the ER.  N/V/D.  Denies LOF, VB.  Some fetal flutters noted. Pt currently is comfortable, denies abdominal pain.  Pt completed 7 days of Flagyl for BV a few weeks ago.  Cervical length in our office on 03/23/16 was 4.2 cm.  VS:  WNL Gen:  NAD, very comfortable. Abd:  Soft, nontender, obese Pelvic:  Vulva-normal, no lesions, dry Vagina-thin, white discharge, scant, fishy odor Cervix-515mm dilation, membranes slightly visualized. No mucopuralent discharge or bleeding.  ~ 3 cm cervix visualized  Wet prep and GC/Chl collected.  Urinalysis    Component Value Date/Time   COLORURINE YELLOW 04/01/2016 1639   APPEARANCEUR HAZY (A) 04/01/2016 1639   LABSPEC 1.024 04/01/2016 1639   PHURINE 5.0 04/01/2016 1639   GLUCOSEU 50 (A) 04/01/2016 1639   HGBUR NEGATIVE 04/01/2016 1639   BILIRUBINUR NEGATIVE 04/01/2016 1639   KETONESUR 80 (A) 04/01/2016 1639   PROTEINUR 30 (A) 04/01/2016 1639   UROBILINOGEN 0.2 12/12/2013 0838   NITRITE NEGATIVE 04/01/2016 1639   LEUKOCYTESUR NEGATIVE 04/01/2016 1639   Microscopic wet-mount exam shows clue cells, few white blood cells, no trich, no yeast.  Ultrasound images reviewed.  No appreciable cervix.  Membranes down to external os c/w exam.   Tocometry-No contractions noted.  A/P  IUP @ 20 2/7 week, Twin gestation (Di/Di) Incompetent cervix vs. PTL.  H/o suspicious for PTL, likely caused by dehydration.  Pt has responded well to 2 doses of Procardia 10 mg. Dehydration BV  D/w Dr. Alpha GulaPaul Whitecar.  I do not feel comfortable placing an emergent cerclage.  [facility] possible [facility] tomorrow if pt is stable and not in labor. Continue IVF. Another Procardia prior to transfer.  BP normal. Start Flagyl.  Give 1 dose prior transfer. F/u GC/Chl  cultures. Pt and family counseled on previable status of babies and informed that no intervention will be offered prior to 23 weeks.  Pt understands her care will now be managed by MFM service and they will make the final decision on whehter to put in cerclage.  All questions answered.

## 2016-04-01 NOTE — MAU Note (Addendum)
Patient c/o abdominal pain that started in upper abdomen and radiated to lower abdomen; first occurrence at 730 this morning and has been off and on since. Describes pain as cramping and gas like; last BM this morning. States she drank a bottle of water and it helped for a "short time" and then the pain returned. Endorses having called her OB and the nurse told her to try tylenol--patient states she vomited that up. No vomiting since. Denies LOF, VB at this time.

## 2016-04-01 NOTE — MAU Provider Note (Addendum)
History     CSN: [id number]  Arrival date and time: 04/01/16 1633   First Provider Initiated Contact with Patient 04/01/16 1706      Chief Complaint  Patient presents with  . Abdominal Pain   HPI  Crystal ShadowLataja S Jobe is a 21 y.o. G1P0000 at 7476w2d with di/di twins who presents with abdominal pain. Symptoms began this morning when she woke up. Reports intermittent abdominal pain; starts in upper abdomen & radiates to lower abdomen. Lasts for 2 minutes at a time. Unsure of frequency but doesn't think it occurs more than 6 times per hour. Feels abdominal tightening when pain occurs. Rates pain 4/10. Tried to take tylenol but immediately vomited. Denies vaginal bleeding or LOF. Last intercourse was over a week ago. Positive fetal movement. Last BM was this morning; loose stool.   OB History    Gravida Para Term Preterm AB Living   1 0 0         SAB TAB Ectopic Multiple Live Births                  Past Medical History:  Diagnosis Date  . WUJWJXBJ(478.2Headache(784.0)     Past Surgical History:  Procedure Laterality Date  . NO PAST SURGERIES      Family History  Problem Relation Age of Onset  . Migraines Mother   . Hypertension Mother   . Diabetes Mother   . Asthma Brother   . Migraines Maternal Aunt     Social History  Substance Use Topics  . Smoking status: Never Smoker  . Smokeless tobacco: Never Used  . Alcohol use No    Allergies:  Allergies  Allergen Reactions  . Bee Venom Hives    Prescriptions Prior to Admission  Medication Sig Dispense Refill Last Dose  . acetaminophen (TYLENOL) 500 MG tablet Take 1,000 mg by mouth every 8 (eight) hours as needed for mild pain or headache.   04/01/2016 at Unknown time  . butalbital-acetaminophen-caffeine (FIORICET, ESGIC) 50-325-40 MG tablet Take 1 tablet by mouth daily as needed for headache.   03/31/2016 at Unknown time  . Prenatal Vit-Fe Fumarate-FA (PRENATAL MULTIVITAMIN) TABS tablet Take 1 tablet by mouth daily at 12 noon.   03/31/2016 at  Unknown time    Review of Systems  Constitutional: Negative for chills and fever.  Gastrointestinal: Positive for abdominal pain and vomiting (vomited once after taking medication). Negative for nausea.  Genitourinary: Negative for vaginal bleeding and vaginal discharge.   Physical Exam   Blood pressure 113/72, pulse 96, temperature 97.6 F (36.4 C), temperature source Oral, resp. rate 18, weight 257 lb 1.3 oz (116.6 kg), last menstrual period 11/11/2015, SpO2 100 %.  Physical Exam  Nursing note and vitals reviewed. Constitutional: She is oriented to person, place, and time. She appears well-developed and well-nourished. No distress.  HENT:  Head: Normocephalic and atraumatic.  Eyes: Conjunctivae are normal. Right eye exhibits no discharge. Left eye exhibits no discharge. No scleral icterus.  Neck: Normal range of motion.  Cardiovascular: Normal rate, regular rhythm and normal heart sounds.   No murmur heard. Respiratory: Effort normal and breath sounds normal. No respiratory distress. She has no wheezes.  GI: Soft. Bowel sounds are normal. There is no tenderness.  Mild ctx palpated   Genitourinary:  Genitourinary Comments: Cervix closed & posterior. Feels smooth & possibly thinned out.   Neurological: She is alert and oriented to person, place, and time.  Skin: Skin is warm and dry. She is not diaphoretic.  Psychiatric: She has a normal mood and affect. Her behavior is normal. Judgment and thought content normal.   MAU Course  Procedures Results for orders placed or performed during the hospital encounter of 04/01/16 (from the past 24 hour(s))  Urinalysis, Routine w reflex microscopic     Status: Abnormal   Collection Time: 04/01/16  4:39 PM  Result Value Ref Range   Color, Urine YELLOW YELLOW   APPearance HAZY (A) CLEAR   Specific Gravity, Urine 1.024 1.005 - 1.030   pH 5.0 5.0 - 8.0   Glucose, UA 50 (A) NEGATIVE mg/dL   Hgb urine dipstick NEGATIVE NEGATIVE   Bilirubin  Urine NEGATIVE NEGATIVE   Ketones, ur 80 (A) NEGATIVE mg/dL   Protein, ur 30 (A) NEGATIVE mg/dL   Nitrite NEGATIVE NEGATIVE   Leukocytes, UA NEGATIVE NEGATIVE   RBC / HPF 0-5 0 - 5 RBC/hpf   WBC, UA 0-5 0 - 5 WBC/hpf   Bacteria, UA NONE SEEN NONE SEEN   Squamous Epithelial / LPF 6-30 (A) NONE SEEN   Mucous PRESENT   CBC     Status: Abnormal   Collection Time: 04/01/16  5:46 PM  Result Value Ref Range   WBC 10.3 4.0 - 10.5 K/uL   RBC 3.60 (L) 3.87 - 5.11 MIL/uL   Hemoglobin 11.3 (L) 12.0 - 15.0 g/dL   HCT 16.1 (L) 09.6 - 04.5 %   MCV 93.3 78.0 - 100.0 fL   MCH 31.4 26.0 - 34.0 pg   MCHC 33.6 30.0 - 36.0 g/dL   RDW 40.9 81.1 - 91.4 %   Platelets 329 150 - 400 K/uL  Comprehensive metabolic panel     Status: Abnormal   Collection Time: 04/01/16  5:46 PM  Result Value Ref Range   Sodium 133 (L) 135 - 145 mmol/L   Potassium 3.5 3.5 - 5.1 mmol/L   Chloride 100 (L) 101 - 111 mmol/L   CO2 25 22 - 32 mmol/L   Glucose, Bld 88 65 - 99 mg/dL   BUN 7 6 - 20 mg/dL   Creatinine, Ser 7.82 0.44 - 1.00 mg/dL   Calcium 8.8 (L) 8.9 - 10.3 mg/dL   Total Protein 7.7 6.5 - 8.1 g/dL   Albumin 3.6 3.5 - 5.0 g/dL   AST 32 15 - 41 U/L   ALT 47 14 - 54 U/L   Alkaline Phosphatase 59 38 - 126 U/L   Total Bilirubin 0.7 0.3 - 1.2 mg/dL   GFR calc non Af Amer >60 >60 mL/min   GFR calc Af Amer >60 >60 mL/min   Anion gap 8 5 - 15   [place] Mfm Ob Transvaginal  Result Date: 04/01/2016 ----------------------------------------------------------------------  OBSTETRICS REPORT                      (Signed Final 04/01/2016 06:55 pm) ---------------------------------------------------------------------- Patient Info  ID #:       [id number]                         D.O.B.:   03-May-1995 (21 yrs)  Name:       Crystal Carrillo                Visit Date:  04/01/2016 06:28 pm ---------------------------------------------------------------------- Performed By  Performed By:     Marcellina Millin          Secondary Phy.:   Demon Volante  RDMS                                                             Kenji Mapel MD  Attending:        Particia Nearing MD       Address:          [address]                                                             [address]                                                             Jacky Kindle                                                             [phone]  Referred By:      MAU Nursing-           Location:         [facility]                    MAU/Triage ---------------------------------------------------------------------- Orders   #  Description                                 Code   1  [place] MFM OB TRANSVAGINAL                      [phone]  ----------------------------------------------------------------------   #  Ordered By               Order #        Accession #    Episode #   1  Judeth Horn            [id number]      [id number]     [id number]  ---------------------------------------------------------------------- Indications   [redacted] weeks gestation of pregnancy                Z3A.20   Twin pregnancy, di/di, second trimester        O30.042   Abdominal pain in pregnancy                    O99.89  ---------------------------------------------------------------------- OB History  Gravidity:    1         Term:   0        Prem:   0        SAB:   0  TOP:          0       Ectopic:  0  Living: 0 ---------------------------------------------------------------------- Fetal Evaluation (Fetus A)  Num Of Fetuses:     2  Fetal Heart         143  Rate(bpm):  Cardiac Activity:   Observed  Fetal Lie:          Lower Fetus  Presentation:       Cephalic ---------------------------------------------------------------------- Gestational Age (Fetus A)  LMP:           20w 2d       Date:   11/11/15                 EDD:   08/17/16  Best:          Cherylann Parr 2d    Det. By:   LMP  (11/11/15)          EDD:   08/17/16 ---------------------------------------------------------------------- Fetal Evaluation (Fetus B)  Num Of  Fetuses:     2  Fetal Heart         158  Rate(bpm):  Cardiac Activity:   Observed  Fetal Lie:          Upper Fetus  Presentation:       Cephalic ---------------------------------------------------------------------- Gestational Age (Fetus B)  LMP:           20w 2d       Date:   11/11/15                 EDD:   08/17/16  Best:          Cherylann Parr 2d    Det. By:   LMP  (11/11/15)          EDD:   08/17/16 ---------------------------------------------------------------------- Cervix Uterus Adnexa  Cervix  Appears funnelled to the external os ---------------------------------------------------------------------- Impression  Dichorionic/diamniotic twin pregnancy at 20+2 weeks  Normal amniotic fluid volume x 2  EV views of cervix: wide U-shaped funneling down to external  os with membranes prolapsing into cervix; difficult to  determine whether external os is dilated; no closed portion  identified; membranes not in vagina ---------------------------------------------------------------------- Recommendations  Follow-up as clinically indicated ----------------------------------------------------------------------                 Particia Nearing, MD Electronically Signed Final Report   04/01/2016 06:55 pm ----------------------------------------------------------------------   MDM FHT 143/158 U/a >80 ketones -- IV fluids ordered -- pt difficult stick; will PO hydrate TVUS ordered to check cervical length Ultrasound shows funneling to external os with membranes prolapsing into cervix Discussed results with Dr. Dion Body. Pt placed on TOCO monitor, procardia ordered, IV fluids reordered. Dr. Dion Body en route to speak with patient regarding results & plan of care  Assessment and Plan  Dr. Dion Body at bedside discussing plan of care with patient.  Judeth Horn 04/01/2016, 5:05 PM      ATTESTATION:  Reviewed and agreed with Mid-level note.  Please see note for full history.  In to evaluate pt by 1910.  Intermittent  abdominal pain starting at 0730-1530 prior to going to the ER.  N/V/D.  Denies LOF, VB.  Some fetal flutters noted. Pt currently is comfortable, denies abdominal pain.  Pt completed 7 days of Flagyl for BV a few weeks ago.  Cervical length in our office on 03/23/16 was 4.2 cm.  VS:  WNL Gen:  NAD, very comfortable. Abd:  Soft, nontender, obese Pelvic:  Vulva-normal, no lesions, dry Vagina-thin, white discharge, scant, fishy odor Cervix-3mm dilation, membranes slightly visualized. No mucopuralent discharge or bleeding.  ~ 3 cm cervix visualized  Wet prep and GC/Chl collected.  Urinalysis Labs (Brief)          Component Value Date/Time   COLORURINE YELLOW 04/01/2016 1639   APPEARANCEUR HAZY (A) 04/01/2016 1639   LABSPEC 1.024 04/01/2016 1639   PHURINE 5.0 04/01/2016 1639   GLUCOSEU 50 (A) 04/01/2016 1639   HGBUR NEGATIVE 04/01/2016 1639   BILIRUBINUR NEGATIVE 04/01/2016 1639   KETONESUR 80 (A) 04/01/2016 1639   PROTEINUR 30 (A) 04/01/2016 1639   UROBILINOGEN 0.2 12/12/2013 0838   NITRITE NEGATIVE 04/01/2016 1639   LEUKOCYTESUR NEGATIVE 04/01/2016 1639     Microscopic wet-mount exam shows clue cells, few white blood cells, no trich, no yeast.  Ultrasound images reviewed.  No appreciable cervix.  Membranes down to external os c/w exam.   Tocometry-No contractions noted.  A/P  IUP @ 20 2/7 week, Twin gestation (Di/Di) Incompetent cervix vs. PTL.  H/o suspicious for PTL, likely caused by dehydration.  Pt has responded well to 2 doses of Procardia 10 mg. Dehydration BV  D/w Dr. Alpha Gula.  I do not feel comfortable placing an emergent cerclage.  Recommends transfer to [facility] for possible cerclage tomorrow if pt is stable and not in labor. Continue IVF. Another Procardia prior to transfer.  BP normal. Start Flagyl.  Give 1 dose prior transfer. F/u GC/Chl cultures. Pt and family counseled on previable status of babies and informed that no  intervention will be offered prior to 23 weeks.  Pt understands her care will now be managed by MFM service and they will make the final decision on whehter to put in cerclage.  All questions answered.  Shela Nevin Dion Body, MD [facility] [phone] - mobile [phone] - pager  04/01/2016

## 2016-04-02 LAB — GC/CHLAMYDIA PROBE AMP (~~LOC~~) NOT AT ARMC
CHLAMYDIA, DNA PROBE: NEGATIVE
NEISSERIA GONORRHEA: NEGATIVE

## 2016-04-09 ENCOUNTER — Ambulatory Visit (HOSPITAL_COMMUNITY)
Admission: RE | Admit: 2016-04-09 | Discharge: 2016-04-09 | Disposition: A | Discharge status: Home / Self Care | Payer: Medicaid Other | Source: Ambulatory Visit | Attending: Maternal and Fetal Medicine | Admitting: Maternal and Fetal Medicine

## 2016-04-09 ENCOUNTER — Other Ambulatory Visit (HOSPITAL_COMMUNITY): Payer: Self-pay | Admitting: Maternal and Fetal Medicine

## 2016-04-09 ENCOUNTER — Encounter (HOSPITAL_COMMUNITY): Payer: Self-pay

## 2016-04-09 DIAGNOSIS — O3432 Maternal care for cervical incompetence, second trimester: Secondary | ICD-10-CM | POA: Diagnosis not present

## 2016-04-09 DIAGNOSIS — Z3A21 21 weeks gestation of pregnancy: Secondary | ICD-10-CM | POA: Diagnosis not present

## 2016-04-10 ENCOUNTER — Other Ambulatory Visit (HOSPITAL_COMMUNITY): Payer: Self-pay | Admitting: *Deleted

## 2016-04-10 DIAGNOSIS — O3432 Maternal care for cervical incompetence, second trimester: Secondary | ICD-10-CM

## 2016-04-16 ENCOUNTER — Encounter (HOSPITAL_COMMUNITY): Payer: Self-pay

## 2016-04-16 ENCOUNTER — Other Ambulatory Visit (HOSPITAL_COMMUNITY): Payer: No Typology Code available for payment source

## 2016-05-22 ENCOUNTER — Encounter: Payer: Self-pay | Admitting: Neurology

## 2016-05-22 ENCOUNTER — Ambulatory Visit (INDEPENDENT_AMBULATORY_CARE_PROVIDER_SITE_OTHER): Payer: No Typology Code available for payment source | Admitting: Neurology

## 2016-05-22 VITALS — BP 138/86 | HR 107 | Temp 98.3°F | Resp 18 | Ht 66.0 in | Wt 253.9 lb

## 2016-05-22 DIAGNOSIS — G43009 Migraine without aura, not intractable, without status migrainosus: Secondary | ICD-10-CM

## 2016-05-22 NOTE — Progress Notes (Signed)
Note sent

## 2016-05-22 NOTE — Patient Instructions (Signed)
Follow up in end of September Limit use of the Fioricet to no more than once a week   Migraine Headache A migraine headache is an intense, throbbing pain on one side or both sides of the head. Migraines may also cause other symptoms, such as nausea, vomiting, and sensitivity to light and noise. What are the causes? Doing or taking certain things may also trigger migraines, such as:  Alcohol.  Smoking.  Medicines, such as:  Medicine used to treat chest pain (nitroglycerine).  Birth control pills.  Estrogen pills.  Certain blood pressure medicines.  Aged cheeses, chocolate, or caffeine.  Foods or drinks that contain nitrates, glutamate, aspartame, or tyramine.  Physical activity. Other things that may trigger a migraine include:  Menstruation.  Pregnancy.  Hunger.  Stress, lack of sleep, too much sleep, or fatigue.  Weather changes. What increases the risk? The following factors may make you more likely to experience migraine headaches:  Age. Risk increases with age.  Family history of migraine headaches.  Being Caucasian.  Depression and anxiety.  Obesity.  Being a woman.  Having a hole in the heart (patent foramen ovale) or other heart problems. What are the signs or symptoms? The main symptom of this condition is pulsating or throbbing pain. Pain may:  Happen in any area of the head, such as on one side or both sides.  Interfere with daily activities.  Get worse with physical activity.  Get worse with exposure to bright lights or loud noises. Other symptoms may include:  Nausea.  Vomiting.  Dizziness.  General sensitivity to bright lights, loud noises, or smells. Before you get a migraine, you may get warning signs that a migraine is developing (aura). An aura may include:  Seeing flashing lights or having blind spots.  Seeing bright spots, halos, or zigzag lines.  Having tunnel vision or blurred vision.  Having numbness or a tingling  feeling.  Having trouble talking.  Having muscle weakness. How is this diagnosed? A migraine headache can be diagnosed based on:  Your symptoms.  A physical exam.  Tests, such as CT scan or MRI of the head. These imaging tests can help rule out other causes of headaches.  Taking fluid from the spine (lumbar puncture) and analyzing it (cerebrospinal fluid analysis, or CSF analysis). How is this treated? A migraine headache is usually treated with medicines that:  Relieve pain.  Relieve nausea.  Prevent migraines from coming back. Treatment may also include:  Acupuncture.  Lifestyle changes like avoiding foods that trigger migraines. Follow these instructions at home: Medicines   Take over-the-counter and prescription medicines only as told by your health care provider.  Do not drive or use heavy machinery while taking prescription pain medicine.  To prevent or treat constipation while you are taking prescription pain medicine, your health care provider may recommend that you:  Drink enough fluid to keep your urine clear or pale yellow.  Take over-the-counter or prescription medicines.  Eat foods that are high in fiber, such as fresh fruits and vegetables, whole grains, and beans.  Limit foods that are high in fat and processed sugars, such as fried and sweet foods. Lifestyle   Avoid alcohol use.  Do not use any products that contain nicotine or tobacco, such as cigarettes and e-cigarettes. If you need help quitting, ask your health care provider.  Get at least 8 hours of sleep every night.  Limit your stress. General instructions    Keep a journal to find out what  may trigger your migraine headaches. For example, write down:  What you eat and drink.  How much sleep you get.  Any change to your diet or medicines.  If you have a migraine:  Avoid things that make your symptoms worse, such as bright lights.  It may help to lie down in a dark, quiet  room.  Do not drive or use heavy machinery.  Ask your health care provider what activities are safe for you while you are experiencing symptoms.  Keep all follow-up visits as told by your health care provider. This is important. Contact a health care provider if:  You develop symptoms that are different or more severe than your usual migraine symptoms. Get help right away if:  Your migraine becomes severe.  You have a fever.  You have a stiff neck.  You have vision loss.  Your muscles feel weak or like you cannot control them.  You start to lose your balance often.  You develop trouble walking.  You faint. This information is not intended to replace advice given to you by your health care provider. Make sure you discuss any questions you have with your health care provider. Document Released: 01/12/2005 Document Revised: 08/02/2015 Document Reviewed: 07/01/2015 Elsevier Interactive Patient Education  2017 [employer].

## 2016-05-22 NOTE — Progress Notes (Signed)
NEUROLOGY CONSULTATION NOTE  Crystal Carrillo MRN: [id number] DOB: 1995/05/22  Referring provider: Geryl Rankins, MD Primary care provider: Milus Height, PA-C  Reason for consult:  headache  HISTORY OF PRESENT ILLNESS: Crystal Carrillo is a 21 year old right-handed female, currently pregnant who presents for migraine.  Onset:  Since highschool.   Location:  Usually right temporal/retro-orbital but varies Quality:  Pressure, pounding, stabbing Intensity:  9/10 Aura:  no Prodrome:  no Postdrome:  no Associated symptoms:  Nausea, photophobia, phonophobia, osmophobia.  She has not had any new worse headache of her life, waking up from sleep Duration:  2 to 3 hours Frequency:  Prior to pregnancy, they were daily.  When she became pregnant, frequency decreased.  She is out of work for the duration of her pregnancy and has only had 3 headache days in the last 2 months. Frequency of abortive medication: infrequent Triggers/exacerbating factors:  Loud sounds, lights, smells Relieving factors:  dark Activity:  Usually cannot function 4 days out of the week  Past NSAIDS:  ibuprofen Past analgesics:  Tylenol Extra-strength Past abortive triptans:  no Past muscle relaxants:  no Past anti-emetic:  no Past antihypertensive medications:  no Past antidepressant medications:  no Past anticonvulsant medications:  no Past vitamins/Herbal/Supplements:  no Past antihistamines/decongestants:  no Other past therapies:  no  Current NSAIDS:  no Current analgesics:  Fioricet Current triptans:  no Current anti-emetic:  no Current muscle relaxants:  no Current anti-anxiolytic:  no Current sleep aide:  no Current Antihypertensive medications:  no Current Antidepressant medications:  no Current Anticonvulsant medications:  no Current Vitamins/Herbal/Supplements:  prenatal Current Antihistamines/Decongestants:  no Other therapy:  no  Caffeine:  Tea, soda Alcohol:  occasionally Smoker:   no Diet:  Trying to increase water intake Exercise:  no Depression/anxiety:  Work-related stress.  She works in a [employer], which triggers her migraines (bright lights, smells of chemicals, radio, skipping meals due to taking on more clients).  Since she is out of work, the headaches almost completely resolved. Sleep hygiene:  good Family history of headache:  Maternal aunt  PAST MEDICAL HISTORY: Past Medical History:  Diagnosis Date  . Headache(784.0)     PAST SURGICAL HISTORY: Past Surgical History:  Procedure Laterality Date  . NO PAST SURGERIES      MEDICATIONS: Current Outpatient Prescriptions on File Prior to Visit  Medication Sig Dispense Refill  . acetaminophen (TYLENOL) 500 MG tablet Take 1,000 mg by mouth every 8 (eight) hours as needed for mild pain or headache.    . butalbital-acetaminophen-caffeine (FIORICET, ESGIC) 50-325-40 MG tablet Take 1 tablet by mouth daily as needed for headache.    . Prenatal Vit-Fe Fumarate-FA (PRENATAL MULTIVITAMIN) TABS tablet Take 1 tablet by mouth daily at 12 noon.     No current facility-administered medications on file prior to visit.     ALLERGIES: Allergies  Allergen Reactions  . Bee Venom Hives    FAMILY HISTORY: Family History  Problem Relation Age of Onset  . Migraines Mother   . Hypertension Mother   . Diabetes Mother   . Asthma Brother   . Migraines Maternal Aunt     SOCIAL HISTORY: Social History   Social History  . Marital status: Single    Spouse name: N/A  . Number of children: N/A  . Years of education: N/A   Occupational History  . Not on file.   Social History Main Topics  . Smoking status: Never Smoker  . Smokeless tobacco:  Never Used  . Alcohol use No  . Drug use: Yes    Types: Marijuana  . Sexual activity: Yes   Other Topics Concern  . Not on file   Social History Narrative  . No narrative on file    REVIEW OF SYSTEMS: Constitutional: No fevers, chills, or sweats, no generalized  fatigue, change in appetite Eyes: No visual changes, double vision, eye pain Ear, nose and throat: No hearing loss, ear pain, nasal congestion, sore throat Cardiovascular: No chest pain, palpitations Respiratory:  No shortness of breath at rest or with exertion, wheezes GastrointestinaI: No nausea, vomiting, diarrhea, abdominal pain, fecal incontinence Genitourinary:  No dysuria, urinary retention or frequency Musculoskeletal:  No neck pain, back pain Integumentary: No rash, pruritus, skin lesions Neurological: as above Psychiatric: No depression, insomnia, anxiety Endocrine: No palpitations, fatigue, diaphoresis, mood swings, change in appetite, change in weight, increased thirst Hematologic/Lymphatic:  No purpura, petechiae. Allergic/Immunologic: no itchy/runny eyes, nasal congestion, recent allergic reactions, rashes  PHYSICAL EXAM: Vitals:   05/22/16 1310  BP: 138/86  Pulse: (!) 107  Resp: 18  Temp: 98.3 F (36.8 C)   General: No acute distress.  Patient appears well-groomed.  Head:  Normocephalic/atraumatic Eyes:  fundi examined but not visualized Neck: supple, no paraspinal tenderness, full range of motion Back: No paraspinal tenderness Heart: regular rate and rhythm Lungs: Clear to auscultation bilaterally. Vascular: No carotid bruits. Neurological Exam: Mental status: alert and oriented to person, place, and time, recent and remote memory intact, fund of knowledge intact, attention and concentration intact, speech fluent and not dysarthric, language intact. Cranial nerves: CN I: not tested CN II: pupils equal, round and reactive to light, visual fields intact CN III, IV, VI:  full range of motion, no nystagmus, no ptosis CN V: facial sensation intact CN VII: upper and lower face symmetric CN VIII: hearing intact CN IX, X: gag intact, uvula midline CN XI: sternocleidomastoid and trapezius muscles intact CN XII: tongue midline Bulk & Tone: normal, no  fasciculations. Motor:  5/5 throughout  Sensation: temperature and vibration sensation intact. Deep Tendon Reflexes:  2+ throughout, toes downgoing.  Finger to nose testing:  Without dysmetria.  Heel to shin:  Without dysmetria.  Gait:  Normal station and stride.  Able to turn and tandem walk. Romberg negative.  IMPRESSION: Chronic migraine without aura, now improved.  Improvement likely due to the pregnancy itself, as well as being out of work which has been a trigger for her migraines (stress, lights, sounds, smells, skipping meals).  PLAN: 1.  She will continue Fioricet limited to no more than 1 days out of the week 2.  She should increase water intake  I will have her follow up after she has her babies.  We can discuss further management if headaches return (especially when she returns to work).  She does plan on breastfeeding, so treatment options would still be limited.  40 minutes spent face to face with patient, over 50% spent discussing management.  Thank you for allowing me to take part in the care of this patient.  Shon Millet, DO  CC:  Noelle Redmon, PA-C  Geryl Rankins, MD

## 2016-05-26 ENCOUNTER — Ambulatory Visit: Payer: No Typology Code available for payment source | Admitting: Neurology

## 2016-11-30 ENCOUNTER — Encounter (HOSPITAL_COMMUNITY): Payer: Self-pay

## 2017-10-09 ENCOUNTER — Emergency Department (HOSPITAL_COMMUNITY): Payer: No Typology Code available for payment source

## 2017-10-09 ENCOUNTER — Encounter (HOSPITAL_COMMUNITY): Payer: Self-pay

## 2017-10-09 ENCOUNTER — Other Ambulatory Visit: Payer: Self-pay

## 2017-10-09 ENCOUNTER — Emergency Department (HOSPITAL_COMMUNITY)
Admission: EM | Admit: 2017-10-09 | Discharge: 2017-10-09 | Disposition: A | Discharge status: Home / Self Care | Payer: No Typology Code available for payment source | Attending: Emergency Medicine | Admitting: Emergency Medicine

## 2017-10-09 DIAGNOSIS — X509XXA Other and unspecified overexertion or strenuous movements or postures, initial encounter: Secondary | ICD-10-CM | POA: Insufficient documentation

## 2017-10-09 DIAGNOSIS — Y999 Unspecified external cause status: Secondary | ICD-10-CM | POA: Insufficient documentation

## 2017-10-09 DIAGNOSIS — Y929 Unspecified place or not applicable: Secondary | ICD-10-CM | POA: Diagnosis not present

## 2017-10-09 DIAGNOSIS — S4991XA Unspecified injury of right shoulder and upper arm, initial encounter: Secondary | ICD-10-CM | POA: Diagnosis present

## 2017-10-09 DIAGNOSIS — Z79899 Other long term (current) drug therapy: Secondary | ICD-10-CM | POA: Diagnosis not present

## 2017-10-09 DIAGNOSIS — S43014A Anterior dislocation of right humerus, initial encounter: Secondary | ICD-10-CM | POA: Diagnosis not present

## 2017-10-09 DIAGNOSIS — Y9389 Activity, other specified: Secondary | ICD-10-CM | POA: Diagnosis not present

## 2017-10-09 MED ORDER — PROPOFOL 10 MG/ML IV BOLUS
1.0000 mg/kg | Freq: Once | INTRAVENOUS | Status: AC
  Administered 2017-10-09: 0.5 mg via INTRAVENOUS
  Filled 2017-10-09: qty 20

## 2017-10-09 MED ORDER — HYDROMORPHONE HCL 1 MG/ML IJ SOLN
0.5000 mg | Freq: Once | INTRAMUSCULAR | Status: AC
  Administered 2017-10-09: 0.5 mg via INTRAVENOUS
  Filled 2017-10-09: qty 1

## 2017-10-09 MED ORDER — METHOCARBAMOL 500 MG PO TABS: 750.0000 mg | ORAL_TABLET | Freq: Once | ORAL | Status: DC

## 2017-10-09 MED ORDER — OXYCODONE-ACETAMINOPHEN 5-325 MG PO TABS: 1.0000 | ORAL_TABLET | Freq: Once | ORAL | Status: DC

## 2017-10-09 MED ORDER — IBUPROFEN 600 MG PO TABS: 600.0000 mg | ORAL_TABLET | Freq: Three times a day (TID) | ORAL | 0 refills | Status: DC | PRN

## 2017-10-09 MED ORDER — PROPOFOL 10 MG/ML IV BOLUS
INTRAVENOUS | Status: AC | PRN
  Administered 2017-10-09: 58.95 mg via INTRAVENOUS

## 2017-10-09 MED ORDER — HYDROCODONE-ACETAMINOPHEN 5-325 MG PO TABS: 1.0000 | ORAL_TABLET | ORAL | 0 refills | Status: AC | PRN

## 2017-10-09 MED ORDER — IBUPROFEN 200 MG PO TABS: 600.0000 mg | ORAL_TABLET | Freq: Once | ORAL | Status: DC

## 2017-10-09 MED ORDER — DIAZEPAM 5 MG/ML IJ SOLN
5.0000 mg | Freq: Once | INTRAMUSCULAR | Status: AC
  Administered 2017-10-09: 5 mg via INTRAVENOUS
  Filled 2017-10-09: qty 2

## 2017-10-09 NOTE — ED Notes (Signed)
Patient explained to about conscious sedation from PA and patient stated to writer it was fine for her clothes to be cut off, due to pain.

## 2017-10-09 NOTE — ED Notes (Signed)
Bed: WHALB Expected date:  Expected time:  Means of arrival:  Comments: 

## 2017-10-09 NOTE — ED Notes (Signed)
Patient on CO2 monitor and cardiac monitoring. Code cart at bedside.

## 2017-10-09 NOTE — ED Notes (Signed)
Bed: WA25 Expected date:  Expected time:  Means of arrival:  Comments: 

## 2017-10-09 NOTE — ED Provider Notes (Signed)
  Physical Exam  BP (!) 155/88 Comment: Simultaneous filing. User may not have seen previous data.  Pulse 92 Comment: Simultaneous filing. User may not have seen previous data.  Temp 98.3 F (36.8 C) (Oral)   Resp 18   Ht 5\' 5"  (1.651 m)   Wt 117.9 kg   SpO2 99% Comment: Simultaneous filing. User may not have seen previous data.  BMI 43.27 kg/m   Physical Exam  ED Course/Procedures     .Sedation Date/Time: 10/09/2017 11:42 PM Performed by: Cy BlamerPalumbo, Daren Doswell, MD Authorized by: Cy BlamerPalumbo, Davyd Podgorski, MD   Consent:    Consent obtained:  Written   Consent given by:  Patient   Risks discussed:  Allergic reaction, dysrhythmia, inadequate sedation, nausea, vomiting, respiratory compromise necessitating ventilatory assistance and intubation, prolonged sedation necessitating reversal and prolonged hypoxia resulting in organ damage Universal protocol:    Procedure explained and questions answered to patient or proxy's satisfaction: yes     Relevant documents present and verified: yes     Test results available and properly labeled: yes     Imaging studies available: yes     Required blood products, implants, devices, and special equipment available: no     Site/side marked: no     Immediately prior to procedure a time out was called: no   Indications:    Procedure performed:  Dislocation reduction   Procedure necessitating sedation performed by:  Physician performing sedation   Intended level of sedation:  Moderate (conscious sedation) Pre-sedation assessment:    Time since last food or drink:  8 hours   ASA classification: class 1 - normal, healthy patient     Neck mobility: normal     Mouth opening:  3 or more finger widths   Mallampati score:  I - soft palate, uvula, fauces, pillars visible   Pre-sedation assessments completed and reviewed: airway patency not reviewed, cardiovascular function not reviewed and hydration status not reviewed   Immediate pre-procedure details:   Reassessment: Patient reassessed immediately prior to procedure     Reviewed: vital signs, relevant labs/tests and NPO status     Verified: bag valve mask available, oxygen available and suction available   Procedure details (see MAR for exact dosages):    Preoxygenation:  Nasal cannula   Sedation:  Propofol   Intra-procedure monitoring:  Blood pressure monitoring and cardiac monitor   Intra-procedure events: none     Total Provider sedation time (minutes):  20 Post-procedure details:    Post-sedation assessment completed:  10/09/2017 7:04 AM   Attendance: Constant attendance by certified staff until patient recovered     Recovery: Patient returned to pre-procedure baseline     Post-sedation assessments completed and reviewed: airway patency not reviewed and mental status not reviewed      MDM        Jemari Hallum, MD 10/09/17 2345

## 2017-10-09 NOTE — Discharge Instructions (Addendum)
Follow up with your primary care physician for recheck in 3 days. Ice to the shoulder joint to reduce swelling. Wear the immobilizer with any activity until cleared by your primary care provider. Take medications as prescribed for pain.

## 2017-10-09 NOTE — ED Notes (Signed)
Patient denies pain and is resting comfortably.  

## 2017-10-09 NOTE — ED Notes (Signed)
Pt awake and talking with mother at bedside.

## 2017-10-09 NOTE — ED Notes (Signed)
Pt provided with hospital scrubs at this time. Pt getting dressed and will be discharged after
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
## 2017-10-09 NOTE — ED Notes (Signed)
Bed: WA11 Expected date:  Expected time:  Means of arrival:  Comments: 

## 2017-10-09 NOTE — ED Notes (Addendum)
Patient given ice for shoulder and stated to writer: "I'm in too much pain to move".

## 2017-10-09 NOTE — ED Notes (Signed)
X RAY at bedside 

## 2017-10-09 NOTE — ED Notes (Signed)
Patient is on cardiac monitoring and cO2 monitor for conscious sedation.
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
## 2017-10-09 NOTE — ED Notes (Signed)
Unable to reach Ortho tech at this time.

## 2017-10-09 NOTE — ED Notes (Signed)
Patient states that her pain is in her right shoulder.

## 2017-10-09 NOTE — ED Notes (Signed)
Patient is woke and having a conversation with mom.

## 2017-10-09 NOTE — ED Notes (Signed)
Patient's visitor at bedside harassing this NT, stating: "Oh so the only reason y'all moved her into a room is because I complained. I see how it is. This room has been open the entire time we've been here and now y'all wanna move her. 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 Writer attempted to explain to patient, that no the room had only been open for a few minutes, and that we were moving the patient because a conscious sedation needed to be completed. 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 Writer explained to visitor that she would not be arguing with the him and if he wanted a further explanation, the RN could explain. Primary RN and Charge RN aware of patients verbal aggression.

## 2018-08-19 ENCOUNTER — Other Ambulatory Visit: Payer: Self-pay

## 2018-08-19 DIAGNOSIS — Z20822 Contact with and (suspected) exposure to covid-19: Secondary | ICD-10-CM

## 2018-08-22 LAB — NOVEL CORONAVIRUS, NAA: SARS-CoV-2, NAA: NOT DETECTED

## 2018-12-15 ENCOUNTER — Other Ambulatory Visit: Payer: Self-pay

## 2018-12-15 DIAGNOSIS — Z20822 Contact with and (suspected) exposure to covid-19: Secondary | ICD-10-CM

## 2018-12-17 LAB — NOVEL CORONAVIRUS, NAA: SARS-CoV-2, NAA: NOT DETECTED

## 2019-03-13 ENCOUNTER — Ambulatory Visit: Payer: PRIVATE HEALTH INSURANCE | Attending: Internal Medicine

## 2019-03-13 DIAGNOSIS — U071 COVID-19: Secondary | ICD-10-CM | POA: Insufficient documentation

## 2019-03-13 DIAGNOSIS — Z20822 Contact with and (suspected) exposure to covid-19: Secondary | ICD-10-CM

## 2019-03-14 LAB — NOVEL CORONAVIRUS, NAA: SARS-CoV-2, NAA: DETECTED — AB

## 2019-03-15 ENCOUNTER — Telehealth: Payer: Self-pay | Admitting: Unknown Physician Specialty

## 2019-03-15 ENCOUNTER — Ambulatory Visit (HOSPITAL_COMMUNITY)
Admission: RE | Admit: 2019-03-15 | Discharge: 2019-03-15 | Disposition: A | Discharge status: Home / Self Care | Payer: PRIVATE HEALTH INSURANCE | Source: Ambulatory Visit | Attending: Pulmonary Disease | Admitting: Pulmonary Disease

## 2019-03-15 ENCOUNTER — Other Ambulatory Visit: Payer: Self-pay | Admitting: Unknown Physician Specialty

## 2019-03-15 DIAGNOSIS — U071 COVID-19: Secondary | ICD-10-CM | POA: Diagnosis not present

## 2019-03-15 MED ORDER — METHYLPREDNISOLONE SODIUM SUCC 125 MG IJ SOLR: 125.0000 mg | Freq: Once | INTRAMUSCULAR | Status: DC | PRN

## 2019-03-15 MED ORDER — SODIUM CHLORIDE 0.9 % IV SOLN
INTRAVENOUS | Status: DC | PRN
  Administered 2019-03-15: 250 mL via INTRAVENOUS

## 2019-03-15 MED ORDER — DIPHENHYDRAMINE HCL 50 MG/ML IJ SOLN: 50.0000 mg | Freq: Once | INTRAMUSCULAR | Status: DC | PRN

## 2019-03-15 MED ORDER — ALBUTEROL SULFATE HFA 108 (90 BASE) MCG/ACT IN AERS: 2.0000 | INHALATION_SPRAY | Freq: Once | RESPIRATORY_TRACT | Status: DC | PRN

## 2019-03-15 MED ORDER — EPINEPHRINE 0.3 MG/0.3ML IJ SOAJ: 0.3000 mg | Freq: Once | INTRAMUSCULAR | Status: DC | PRN

## 2019-03-15 MED ORDER — SODIUM CHLORIDE 0.9 % IV SOLN
700.0000 mg | Freq: Once | INTRAVENOUS | Status: AC
  Administered 2019-03-15: 15:00:00 700 mg via INTRAVENOUS
  Filled 2019-03-15: qty 20

## 2019-03-15 MED ORDER — FAMOTIDINE IN NACL 20-0.9 MG/50ML-% IV SOLN: 20.0000 mg | Freq: Once | INTRAVENOUS | Status: DC | PRN

## 2019-03-15 NOTE — Discharge Instructions (Signed)

## 2019-03-15 NOTE — Telephone Encounter (Signed)
Discussed with patient about Covid symptoms and the use of bamlanivimab, a monoclonal antibody infusion for those with mild to moderate Covid symptoms and at a high risk of hospitalization.  Pt is qualified for this infusion at the [facility] infusion center due to BMI>35   She will talk it over with her mom who is a Engineer, civil (consulting)

## 2019-03-15 NOTE — Progress Notes (Signed)
  I connected by phone with Crystal Carrillo on 03/15/2019 at 9:13 AM to discuss the potential use of an new treatment for mild to moderate COVID-19 viral infection in non-hospitalized patients.  This patient is a 24 y.o. female that meets the FDA criteria for Emergency Use Authorization of bamlanivimab or casirivimab\imdevimab.  Has a (+) direct SARS-CoV-2 viral test result  Has mild or moderate COVID-19   Is ? 26 years of age and weighs ? 40 kg  Is NOT hospitalized due to COVID-19  Is NOT requiring oxygen therapy or requiring an increase in baseline oxygen flow rate due to COVID-19  Is within 10 days of symptom onset  Has at least one of the high risk factor(s) for progression to severe COVID-19 and/or hospitalization as defined in EUA.  Specific high risk criteria : BMI >/= 35   I have spoken and communicated the following to the patient or parent/caregiver:  1. FDA has authorized the emergency use of bamlanivimab and casirivimab\imdevimab for the treatment of mild to moderate COVID-19 in adults and pediatric patients with positive results of direct SARS-CoV-2 viral testing who are 2 years of age and older weighing at least 40 kg, and who are at high risk for progressing to severe COVID-19 and/or hospitalization.  2. The significant known and potential risks and benefits of bamlanivimab and casirivimab\imdevimab, and the extent to which such potential risks and benefits are unknown.  3. Information on available alternative treatments and the risks and benefits of those alternatives, including clinical trials.  4. Patients treated with bamlanivimab and casirivimab\imdevimab should continue to self-isolate and use infection control measures (e.g., wear mask, isolate, social distance, avoid sharing personal items, clean and disinfect "high touch" surfaces, and frequent handwashing) according to CDC guidelines.   5. The patient or parent/caregiver has the option to accept or refuse  bamlanivimab or casirivimab\imdevimab .  After reviewing this information with the patient, The patient agreed to proceed with receiving the bamlanimivab infusion and will be provided a copy of the Fact sheet prior to receiving the infusion.Crystal Carrillo 03/15/2019 9:13 AM  Pt on day 8 of illness

## 2019-03-15 NOTE — Progress Notes (Signed)
  Diagnosis: COVID-19  Physician: Dr. Wright  Procedure: [facility] Med: bamlanivimab infusion - Provided patient with bamlanimivab fact sheet for patients, parents and caregivers prior to infusion.  Complications: No immediate complications noted.  Discharge: Discharged home   Crystal Carrillo 03/15/2019   

## 2019-07-10 ENCOUNTER — Ambulatory Visit: Payer: PRIVATE HEALTH INSURANCE

## 2019-09-19 ENCOUNTER — Other Ambulatory Visit: Payer: PRIVATE HEALTH INSURANCE

## 2019-09-19 ENCOUNTER — Other Ambulatory Visit: Payer: Self-pay

## 2019-09-19 DIAGNOSIS — Z20822 Contact with and (suspected) exposure to covid-19: Secondary | ICD-10-CM

## 2019-09-20 LAB — SARS-COV-2, NAA 2 DAY TAT

## 2019-09-20 LAB — NOVEL CORONAVIRUS, NAA: SARS-CoV-2, NAA: NOT DETECTED

## 2020-12-30 ENCOUNTER — Other Ambulatory Visit: Payer: Self-pay

## 2020-12-30 ENCOUNTER — Emergency Department (HOSPITAL_COMMUNITY)
Admission: EM | Admit: 2020-12-30 | Discharge: 2020-12-30 | Disposition: A | Discharge status: Home / Self Care | Payer: PRIVATE HEALTH INSURANCE | Attending: Emergency Medicine | Admitting: Emergency Medicine

## 2020-12-30 ENCOUNTER — Encounter (HOSPITAL_COMMUNITY): Payer: Self-pay

## 2020-12-30 DIAGNOSIS — Z20822 Contact with and (suspected) exposure to covid-19: Secondary | ICD-10-CM | POA: Insufficient documentation

## 2020-12-30 DIAGNOSIS — J101 Influenza due to other identified influenza virus with other respiratory manifestations: Secondary | ICD-10-CM

## 2020-12-30 DIAGNOSIS — J09X9 Influenza due to identified novel influenza A virus with other manifestations: Secondary | ICD-10-CM | POA: Insufficient documentation

## 2020-12-30 DIAGNOSIS — R519 Headache, unspecified: Secondary | ICD-10-CM | POA: Insufficient documentation

## 2020-12-30 DIAGNOSIS — Z79899 Other long term (current) drug therapy: Secondary | ICD-10-CM | POA: Diagnosis not present

## 2020-12-30 DIAGNOSIS — J029 Acute pharyngitis, unspecified: Secondary | ICD-10-CM | POA: Diagnosis present

## 2020-12-30 DIAGNOSIS — F17200 Nicotine dependence, unspecified, uncomplicated: Secondary | ICD-10-CM | POA: Diagnosis not present

## 2020-12-30 LAB — RESP PANEL BY RT-PCR (FLU A&B, COVID) ARPGX2
Influenza A by PCR: POSITIVE — AB
Influenza B by PCR: NEGATIVE
SARS Coronavirus 2 by RT PCR: NEGATIVE

## 2020-12-30 MED ORDER — OSELTAMIVIR PHOSPHATE 75 MG PO CAPS: 75.0000 mg | ORAL_CAPSULE | Freq: Two times a day (BID) | ORAL | 0 refills | Status: DC

## 2020-12-30 MED ORDER — ACETAMINOPHEN 325 MG PO TABS
650.0000 mg | ORAL_TABLET | Freq: Once | ORAL | Status: AC | PRN
  Administered 2020-12-30: 650 mg via ORAL
  Filled 2020-12-30: qty 2

## 2020-12-30 NOTE — Discharge Instructions (Addendum)
You have tested positive for the flu today. Please pick up prescription and take as prescribed.   It is recommended that you stay home for 5 days (cleared: 12/08). You can return to work on this date as long as you are fever free for > 24 hours without fever reducing medications.   Follow up with your PCP for further evaluation   Return to the ED for any new/worsening symptoms

## 2020-12-30 NOTE — ED Provider Notes (Signed)
[facility]-EMERGENCY DEPT Provider Note   CSN: [id number] Arrival date & time: 12/30/20  1937     History Chief Complaint  Patient presents with   Headache   Generalized Body Aches   Cough   Sore Throat    Crystal Carrillo is a 25 y.o. female who presents to the ED today with complaint of URI like symptoms x 2 days. Pt complains of cough, fever, sore throat, rhinorrhea, congestion, body aches, headache. No recent sick contacts. Vaccinated for COVID. No vaccinated for flu. Has been taking Tylenol at home without much relief.     The history is provided by the patient and medical records.      Past Medical History:  Diagnosis Date   Headache(784.0)     Patient Active Problem List   Diagnosis Date Noted   Acute pelvic inflammatory disease (PID) 02/11/2012   Headache(784.0)    RHINITIS, ALLERGIC 03/25/2006    Past Surgical History:  Procedure Laterality Date   CESAREAN SECTION     NO PAST SURGERIES       OB History     Gravida  1   Para  0   Term  0   Preterm      AB      Living  0      SAB      IAB      Ectopic      Multiple      Live Births              Family History  Problem Relation Age of Onset   Migraines Mother    Hypertension Mother    Diabetes Mother    Asthma Brother    Migraines Maternal Aunt     Social History   Tobacco Use   Smoking status: Never   Smokeless tobacco: Never  Vaping Use   Vaping Use: Some days   Substances: Flavoring  Substance Use Topics   Alcohol use: No   Drug use: Not Currently    Types: Marijuana    Home Medications Prior to Admission medications   Medication Sig Start Date End Date Taking? Authorizing Provider  oseltamivir (TAMIFLU) 75 MG capsule Take 1 capsule (75 mg total) by mouth every 12 (twelve) hours. 12/30/20  Yes Javiel Canepa, PA-C  HYDROcodone-acetaminophen (NORCO/VICODIN) 5-325 MG tablet Take 1 tablet by mouth every 4 (four) hours as needed for severe  pain. 10/09/17   Elpidio Anis, PA-C  ibuprofen (IBU) 600 MG tablet Take 1 tablet (600 mg total) by mouth every 8 (eight) hours as needed for moderate pain. 10/09/17   Elpidio Anis, PA-C  Multiple Vitamin (MULTIVITAMIN WITH MINERALS) TABS tablet Take 1 tablet by mouth daily.    [provider]    Allergies    Bee venom and Pollen extract  Review of Systems   Review of Systems  Constitutional:  Positive for chills, fatigue and fever.  HENT:  Positive for congestion and sore throat. Negative for trouble swallowing and voice change.   Eyes:  Negative for visual disturbance.  Respiratory:  Positive for cough. Negative for shortness of breath.   Gastrointestinal:  Negative for diarrhea, nausea and vomiting.  Musculoskeletal:  Positive for myalgias.  Neurological:  Positive for headaches.  All other systems reviewed and are negative.  Physical Exam Updated Vital Signs BP (!) 151/97 (BP Location: Left Arm)   Pulse 97   Temp (!) 100.7 F (38.2 C) (Oral)   Resp 18  Ht 5\' 6"  (1.676 m)   Wt 122.5 kg   SpO2 97%   BMI 43.58 kg/m   Physical Exam Vitals and nursing note reviewed.  Constitutional:      Appearance: She is not ill-appearing.  HENT:     Head: Normocephalic and atraumatic.  Eyes:     Conjunctiva/sclera: Conjunctivae normal.  Cardiovascular:     Rate and Rhythm: Normal rate and regular rhythm.  Pulmonary:     Effort: Pulmonary effort is normal.     Breath sounds: Normal breath sounds. No wheezing, rhonchi or rales.  Skin:    General: Skin is warm and dry.     Coloration: Skin is not jaundiced.  Neurological:     Mental Status: She is alert.    ED Results / Procedures / Treatments   Labs (all labs ordered are listed, but only abnormal results are displayed) Labs Reviewed  RESP PANEL BY RT-PCR (FLU A&B, COVID) ARPGX2 - Abnormal; Notable for the following components:      Result Value   Influenza A by PCR POSITIVE (*)    All other components within  normal limits    EKG None  Radiology No results found.  Procedures Procedures   Medications Ordered in ED Medications  acetaminophen (TYLENOL) tablet 650 mg (650 mg Oral Given 12/30/20 [id number])    ED Course  I have reviewed the triage vital signs and the nursing notes.  Pertinent labs & imaging results that were available during my care of the patient were reviewed by me and considered in my medical decision making (see chart for details).    MDM Rules/Calculators/A&P                           25 year old female who presents to the ED today with URI-like symptoms for the past 2 days.  On arrival to the ED she was found to be febrile at 100.7.  Remainder vitals unremarkable. Tylenol was provided for fever.  On exam she does appear slightly fatigued.  She is coughing.  She is speaking full sentences without difficulty and satting 97% on room air.  COVID and flu test were done, flu a has returned positive at this time.  Patient is currently in the window for Tamiflu and would like to try it for symptom relief.  I did have lengthy discussion with patient regarding risk factors of same including side effects however patient would still like to try.  She has Nexplanon.  She denies risk of pregnancy.  Will discharge home at this time with Tamiflu.  Patient instructed to follow-up with PCP for further evaluation and to return to the ED for any new/worsening symptoms.  She is in agreement with plan and stable for discharge.   This note was prepared using Dragon voice recognition software and may include unintentional dictation errors due to the inherent limitations of voice recognition software.   Final Clinical Impression(s) / ED Diagnoses Final diagnoses:  Influenza A    Rx / DC Orders ED Discharge Orders          Ordered    oseltamivir (TAMIFLU) 75 MG capsule  Every 12 hours        12/30/20 1247             Discharge Instructions      You have tested positive for the flu  today. Please pick up prescription and take as prescribed.   It is recommended that you stay  home for 5 days (cleared: 12/08). You can return to work on this date as long as you are fever free for > 24 hours without fever reducing medications.   Follow up with your PCP for further evaluation   Return to the ED for any new/worsening symptoms       Tanda Rockers, PA-C 12/30/20 1249    Linwood Dibbles, MD 01/01/21 1012

## 2020-12-30 NOTE — ED Provider Notes (Signed)
Emergency Medicine Provider Triage Evaluation Note  Crystal Carrillo , a 25 y.o. female  was evaluated in triage.  Pt complains of URI like symptoms x 2 days. Pt complains of cough, fever, sore throat, rhinorrhea, congestion, body aches, headache. No recent sick contacts. Vaccinated for COVID. No vaccinated for flu.   Review of Systems  Positive: + fever, sore throat, cough, rhinorrhea, congestion, body aches, headache Negative: - vomiting, diarrhea, difficulty swallowing  Physical Exam  BP (!) 143/85 (BP Location: Left Arm)   Pulse 90   Temp (!) 100.7 F (38.2 C) (Oral)   Resp 16   Ht 5\' 6"  (1.676 m)   Wt 122.5 kg   SpO2 97%   BMI 43.58 kg/m  Gen:   Awake, no distress   Resp:  Normal effort  MSK:   Moves extremities without difficulty  Other:    Medical Decision Making  Medically screening exam initiated at 11:15 AM.  Appropriate orders placed.  STEPHANIE LITTMAN was informed that the remainder of the evaluation will be completed by another provider, this initial triage assessment does not replace that evaluation, and the importance of remaining in the ED until their evaluation is complete.     Walker Shadow, PA-C 12/30/20 1116    14/05/22, MD 01/01/21 1012

## 2020-12-30 NOTE — ED Triage Notes (Signed)
Patient c/o sore throat, a non productive cough, body aches, and a headache x 2 days.

## 2022-04-30 ENCOUNTER — Ambulatory Visit (HOSPITAL_COMMUNITY)
Admission: EM | Admit: 2022-04-30 | Discharge: 2022-04-30 | Disposition: A | Discharge status: Home / Self Care | Payer: Medicaid Other | Attending: Internal Medicine | Admitting: Internal Medicine

## 2022-04-30 ENCOUNTER — Encounter (HOSPITAL_COMMUNITY): Payer: Self-pay | Admitting: *Deleted

## 2022-04-30 DIAGNOSIS — J020 Streptococcal pharyngitis: Secondary | ICD-10-CM

## 2022-04-30 LAB — POCT RAPID STREP A, ED / UC: Streptococcus, Group A Screen (Direct): POSITIVE — AB

## 2022-04-30 MED ORDER — ACETAMINOPHEN 325 MG PO TABS
650.0000 mg | ORAL_TABLET | Freq: Once | ORAL | Status: AC
  Administered 2022-04-30: 650 mg via ORAL

## 2022-04-30 MED ORDER — ACETAMINOPHEN 325 MG PO TABS
ORAL_TABLET | ORAL | Status: AC
  Filled 2022-04-30: qty 2

## 2022-04-30 MED ORDER — AMOXICILLIN 500 MG PO CAPS: 500.0000 mg | ORAL_CAPSULE | Freq: Two times a day (BID) | ORAL | 0 refills | Status: AC

## 2022-04-30 MED ORDER — IBUPROFEN 600 MG PO TABS: 600.0000 mg | ORAL_TABLET | Freq: Three times a day (TID) | ORAL | 0 refills | Status: AC | PRN

## 2022-04-30 MED ORDER — IBUPROFEN 600 MG PO TABS: 600.0000 mg | ORAL_TABLET | Freq: Three times a day (TID) | ORAL | 0 refills | Status: DC | PRN

## 2022-04-30 NOTE — ED Triage Notes (Signed)
Pt states her throat started hurting Tuesday but has got worse as the days went on this week. Today she developed chills and runny nose. She has use OTC throat spray and cough drops.

## 2022-04-30 NOTE — Discharge Instructions (Addendum)
Warm salt water gargle Please take antibiotics as prescribed and ensure that you complete course of antibiotics Ibuprofen as needed for pain and/or fever Maintain adequate hydration Your strep test was positive Please return to urgent care if you have worsening symptoms or any other concerns.

## 2022-04-30 NOTE — ED Provider Notes (Signed)
[facility]    CSN: [id number] Arrival date & time: 04/30/22  1711      History   Chief Complaint Chief Complaint  Patient presents with   Sore Throat    HPI Crystal Carrillo is a 27 y.o. female comes to the urgent care with a 2-day history of sore throat.  Patient says sore throat started fairly abruptly and has been persistent.  She has severe pain on swallowing.  She also developed some chills and subjective fever at the after the symptoms started.  She denies any significant nasal congestion or runny nose.  She has tried over-the-counter nasal spray and cough drops with no improvement in her symptoms.  No shortness of breath or wheezing.  No ear pain.   HPI  Past Medical History:  Diagnosis Date   Headache(784.0)     Patient Active Problem List   Diagnosis Date Noted   Acute pelvic inflammatory disease (PID) 02/11/2012   Headache(784.0)    RHINITIS, ALLERGIC 03/25/2006    Past Surgical History:  Procedure Laterality Date   CESAREAN SECTION     NO PAST SURGERIES      OB History     Gravida  1   Para  0   Term  0   Preterm      AB      Living  0      SAB      IAB      Ectopic      Multiple      Live Births               Home Medications    Prior to Admission medications   Medication Sig Start Date End Date Taking? Authorizing Provider  amoxicillin (AMOXIL) 500 MG capsule Take 1 capsule (500 mg total) by mouth 2 (two) times daily for 10 days. 04/30/22 05/10/22 Yes Ariadne Rissmiller, Myrene Galas, MD  etonogestrel (NEXPLANON) 68 MG IMPL implant 68 mg by subdermal route. 01/07/22  Yes [provider]  HYDROcodone-acetaminophen (NORCO/VICODIN) 5-325 MG tablet Take 1 tablet by mouth every 4 (four) hours as needed for severe pain. 10/09/17   Charlann Lange, PA-C  ibuprofen (IBU) 600 MG tablet Take 1 tablet (600 mg total) by mouth every 8 (eight) hours as needed for moderate pain. 04/30/22   LampteyMyrene Galas, MD  Multiple Vitamin (MULTIVITAMIN  WITH MINERALS) TABS tablet Take 1 tablet by mouth daily.    [provider]    Family History Family History  Problem Relation Age of Onset   Migraines Mother    Hypertension Mother    Diabetes Mother    Asthma Brother    Migraines Maternal Aunt     Social History Social History   Tobacco Use   Smoking status: Never   Smokeless tobacco: Never  Vaping Use   Vaping Use: Some days   Substances: Flavoring  Substance Use Topics   Alcohol use: No   Drug use: Not Currently    Types: Marijuana     Allergies   Bee venom and Pollen extract   Review of Systems Review of Systems As per HPI  Physical Exam Triage Vital Signs ED Triage Vitals  Enc Vitals Group     BP 04/30/22 1805 136/84     Pulse Rate 04/30/22 1805 92     Resp 04/30/22 1805 18     Temp 04/30/22 1805 (!) 100.5 F (38.1 C)     Temp Source 04/30/22 1805 Oral  SpO2 04/30/22 1805 98 %     Weight --      Height --      Head Circumference --      Peak Flow --      Pain Score 04/30/22 1804 8     Pain Loc --      Pain Edu? --      Excl. in [place]? --    No data found.  Updated Vital Signs BP 136/84 (BP Location: Right Arm)   Pulse 92   Temp (!) 100.5 F (38.1 C) (Oral)   Resp 18   LMP 04/15/2022 (Approximate)   SpO2 98%   Visual Acuity Right Eye Distance:   Left Eye Distance:   Bilateral Distance:    Right Eye Near:   Left Eye Near:    Bilateral Near:     Physical Exam Vitals and nursing note reviewed.  Constitutional:      General: She is not in acute distress.    Appearance: She is not ill-appearing.  HENT:     Right Ear: Tympanic membrane normal.     Left Ear: Tympanic membrane normal.     Mouth/Throat:     Mouth: Mucous membranes are moist.     Pharynx: Uvula midline. Posterior oropharyngeal erythema present.     Tonsils: Tonsillar exudate present. 2+ on the right. 2+ on the left.  Cardiovascular:     Rate and Rhythm: Normal rate and regular rhythm.  Pulmonary:      Effort: Pulmonary effort is normal.     Breath sounds: Normal breath sounds.  Neurological:     Mental Status: She is alert.      UC Treatments / Results  Labs (all labs ordered are listed, but only abnormal results are displayed) Labs Reviewed  POCT RAPID STREP A, ED / UC - Abnormal; Notable for the following components:      Result Value   Streptococcus, Group A Screen (Direct) POSITIVE (*)    All other components within normal limits    EKG   Radiology No results found.  Procedures Procedures (including critical care time)  Medications Ordered in UC Medications  acetaminophen (TYLENOL) tablet 650 mg (650 mg Oral Given 04/30/22 1811)    Initial Impression / Assessment and Plan / UC Course  I have reviewed the triage vital signs and the nursing notes.  Pertinent labs & imaging results that were available during my care of the patient were reviewed by me and considered in my medical decision making (see chart for details).     1.  Acute streptococcal pharyngitis: Amoxicillin 500 mg twice daily for 10 days Ibuprofen 600mg  every 6 hours as needed for pain and/or fever Warm salt water gargle as needed Increase oral fluid intake If you have worsening symptoms please return to urgent care to be reevaluated.  Final Clinical Impressions(s) / UC Diagnoses   Final diagnoses:  Acute streptococcal pharyngitis     Discharge Instructions      Warm salt water gargle Please take antibiotics as prescribed and ensure that you complete course of antibiotics Ibuprofen as needed for pain and/or fever Maintain adequate hydration Your strep test was positive Please return to urgent care if you have worsening symptoms or any other concerns.   ED Prescriptions     Medication Sig Dispense Auth. Provider   amoxicillin (AMOXIL) 500 MG capsule Take 1 capsule (500 mg total) by mouth 2 (two) times daily for 10 days. 20 capsule Dimitrius Steedman, Myrene Galas, MD  ibuprofen (IBU) 600 MG tablet   (Status: Discontinued) Take 1 tablet (600 mg total) by mouth every 8 (eight) hours as needed for moderate pain. 30 tablet Skylynn Burkley, Myrene Galas, MD   ibuprofen (IBU) 600 MG tablet Take 1 tablet (600 mg total) by mouth every 8 (eight) hours as needed for moderate pain. 30 tablet Rebekka Lobello, Myrene Galas, MD      PDMP not reviewed this encounter.   Chase Picket, MD 04/30/22 [phone]

## 2022-09-27 ENCOUNTER — Emergency Department (HOSPITAL_BASED_OUTPATIENT_CLINIC_OR_DEPARTMENT_OTHER): Payer: Medicaid Other

## 2022-09-27 ENCOUNTER — Emergency Department (HOSPITAL_BASED_OUTPATIENT_CLINIC_OR_DEPARTMENT_OTHER)
Admission: EM | Admit: 2022-09-27 | Discharge: 2022-09-27 | Disposition: A | Discharge status: Home / Self Care | Payer: Medicaid Other | Attending: Emergency Medicine | Admitting: Emergency Medicine

## 2022-09-27 ENCOUNTER — Other Ambulatory Visit: Payer: Self-pay

## 2022-09-27 DIAGNOSIS — M79642 Pain in left hand: Secondary | ICD-10-CM | POA: Diagnosis present

## 2022-09-27 DIAGNOSIS — M25561 Pain in right knee: Secondary | ICD-10-CM | POA: Insufficient documentation

## 2022-09-27 DIAGNOSIS — S8991XA Unspecified injury of right lower leg, initial encounter: Secondary | ICD-10-CM

## 2022-09-27 NOTE — Discharge Instructions (Addendum)
Ice, Tylenol, ibuprofen.  Follow-up with orthopedics.  Use Ace wrap and splint for support.
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
## 2023-12-09 ENCOUNTER — Encounter (INDEPENDENT_AMBULATORY_CARE_PROVIDER_SITE_OTHER): Payer: Self-pay

## 2024-01-11 ENCOUNTER — Other Ambulatory Visit: Payer: Self-pay

## 2024-01-11 ENCOUNTER — Emergency Department (HOSPITAL_BASED_OUTPATIENT_CLINIC_OR_DEPARTMENT_OTHER)
Admission: EM | Admit: 2024-01-11 | Discharge: 2024-01-11 | Disposition: A | Discharge status: Home / Self Care | Payer: Self-pay

## 2024-01-11 DIAGNOSIS — K0889 Other specified disorders of teeth and supporting structures: Secondary | ICD-10-CM

## 2024-01-11 MED ORDER — LIDOCAINE VISCOUS HCL 2 % MT SOLN
15.0000 mL | Freq: Once | OROMUCOSAL | Status: AC
  Administered 2024-01-11: 10:00:00 15 mL via OROMUCOSAL
  Filled 2024-01-11: qty 15

## 2024-01-11 MED ORDER — LIDOCAINE VISCOUS HCL 2 % MT SOLN: 15.0000 mL | OROMUCOSAL | 0 refills | Status: AC | PRN

## 2024-01-11 MED ORDER — AMOXICILLIN-POT CLAVULANATE 875-125 MG PO TABS: 1.0000 | ORAL_TABLET | Freq: Two times a day (BID) | ORAL | 0 refills | Status: AC

## 2024-01-11 MED ORDER — IBUPROFEN 800 MG PO TABS
800.0000 mg | ORAL_TABLET | Freq: Once | ORAL | Status: AC
  Administered 2024-01-11: 10:00:00 800 mg via ORAL
  Filled 2024-01-11: qty 1

## 2024-01-11 NOTE — ED Provider Notes (Signed)
 Crystal Carrillo Provider Note   CSN: [id number] Arrival date & time: 01/11/24  9240     Patient presents with: Dental Pain   Crystal Carrillo is a 28 y.o. female patient who presents to the emergency department today for further evaluation of dental pain which has been ongoing for the last 2 days.  Patient is complaining of right lower dental pain.  She does not currently have a dentist.  She has been using over-the-counter ibuprofen  with little relief.  Denies fever, chills, trouble swallowing or breathing.    Dental Pain      Prior to Admission medications  Medication Sig Start Date End Date Taking? Authorizing Provider  amoxicillin -clavulanate (AUGMENTIN ) 875-125 MG tablet Take 1 tablet by mouth every 12 (twelve) hours. 01/11/24  Yes Theotis, Katelen Luepke M, PA-C  lidocaine  (XYLOCAINE ) 2 % solution Use as directed 15 mLs in the mouth or throat as needed for mouth pain. 01/11/24  Yes Chaselynn Kepple M, PA-C  etonogestrel (NEXPLANON) 68 MG IMPL implant 68 mg by subdermal route. 01/07/22   [provider]  HYDROcodone -acetaminophen  (NORCO/VICODIN) 5-325 MG tablet Take 1 tablet by mouth every 4 (four) hours as needed for severe pain. 10/09/17   Odell Balls, PA-C  ibuprofen  (IBU) 600 MG tablet Take 1 tablet (600 mg total) by mouth every 8 (eight) hours as needed for moderate pain. 04/30/22   LampteyAleene KIDD, MD  Multiple Vitamin (MULTIVITAMIN WITH MINERALS) TABS tablet Take 1 tablet by mouth daily.    [provider]  Vitamin D, Ergocalciferol, (DRISDOL) 1.25 MG (50000 UNIT) CAPS capsule Take 50,000 Units by mouth once a week.    [provider]    Allergies: Bee venom and Pollen extract    Review of Systems  All other systems reviewed and are negative.   Updated Vital Signs BP (!) 143/79 (BP Location: Right Arm)   Pulse 65   Temp 98.4 F (36.9 C) (Oral)   Resp 18   SpO2 99%   Physical Exam Vitals and nursing  note reviewed.  Constitutional:      Appearance: Normal appearance.  HENT:     Head: Normocephalic and atraumatic.     Mouth/Throat:      Comments: Posterior pharynx is normal.  Uvula is midline and nonedematous.  Tongue is normal.  No swelling of the oral floor. Eyes:     General:        Right eye: No discharge.        Left eye: No discharge.     Conjunctiva/sclera: Conjunctivae normal.  Pulmonary:     Effort: Pulmonary effort is normal.  Skin:    General: Skin is warm and dry.     Findings: No rash.  Neurological:     General: No focal deficit present.     Mental Status: She is alert.  Psychiatric:        Mood and Affect: Mood normal.        Behavior: Behavior normal.     (all labs ordered are listed, but only abnormal results are displayed) Labs Reviewed - No data to display  EKG: None  Radiology: No results found.   Procedures   Medications Ordered in the ED  lidocaine  (XYLOCAINE ) 2 % viscous mouth solution 15 mL (15 mLs Mouth/Throat Given 01/11/24 0935)  ibuprofen  (ADVIL ) tablet 800 mg (800 mg Oral Given 01/11/24 0935)     Medical Decision Making Crystal Carrillo is a 28 y.o. female patient who  presents to the emergency department today for further evaluation of dental pain.  Likely related to that third molar on the bottom right.  No obvious drainable abscess today.  Will treat with Augmentin  and viscous Xylocaine .  I will also give her resource guide for a dentist so she can call and schedule an appointment.  Low suspicion for Ludwig's angina.  Patient is overall nontoxic-appearing.  Vital signs are normal apart from some slightly high blood pressure which could be related to pain.  There is some mild superficial maxillary swelling likely from this tooth.  Strict return precautions were discussed.  She is safe for discharge.   Risk Prescription drug management.    Final diagnoses:  Pain, dental    ED Discharge Orders          Ordered     amoxicillin -clavulanate (AUGMENTIN ) 875-125 MG tablet  Every 12 hours        01/11/24 0929    lidocaine  (XYLOCAINE ) 2 % solution  As needed        01/11/24 0930               Theotis Crystal Carrillo, [place] 01/11/24 9062    Crystal Caron PARAS, DO 01/11/24 1428

## 2024-01-11 NOTE — ED Triage Notes (Signed)
 Reports R sided tooth ache with ear pain since Sunday. Denies fevers.

## 2024-01-11 NOTE — Discharge Instructions (Addendum)
 Please call one of the dentist from the list provided to get in for follow-up appointment.  Please continue taking 600 mg of ibuprofen  every 6 hours as needed for pain.  Have also given you some viscous Xylocaine  which you can swish and spit to help with pain control.  Please return to the emergency department for any worsening symptoms.
